# Patient Record
Sex: Male | Born: 1970 | Race: White | Hispanic: No | Marital: Single | Smoking: Former smoker
Health system: Southern US, Community
[De-identification: ages and names within clinical notes are randomized; demographics above are authoritative.]

## PROBLEM LIST (undated history)

## (undated) DIAGNOSIS — F319 Bipolar disorder, unspecified: Secondary | ICD-10-CM

## (undated) DIAGNOSIS — I2119 ST elevation (STEMI) myocardial infarction involving other coronary artery of inferior wall: Secondary | ICD-10-CM

## (undated) DIAGNOSIS — E785 Hyperlipidemia, unspecified: Secondary | ICD-10-CM

## (undated) DIAGNOSIS — I10 Essential (primary) hypertension: Secondary | ICD-10-CM

## (undated) HISTORY — DX: ST elevation (STEMI) myocardial infarction involving other coronary artery of inferior wall: I21.19

## (undated) HISTORY — DX: Essential (primary) hypertension: I10

---

## 2017-11-21 ENCOUNTER — Ambulatory Visit (HOSPITAL_COMMUNITY): Admit: 2017-11-21 | Payer: Self-pay | Admitting: Cardiology

## 2017-11-21 ENCOUNTER — Inpatient Hospital Stay (HOSPITAL_COMMUNITY)
Admission: EM | Disposition: A | Discharge status: Home / Self Care | Payer: Self-pay | Source: Home / Self Care | Attending: Cardiology

## 2017-11-21 ENCOUNTER — Encounter (HOSPITAL_COMMUNITY): Payer: Self-pay | Admitting: Cardiology

## 2017-11-21 ENCOUNTER — Inpatient Hospital Stay (HOSPITAL_COMMUNITY)
Admission: EM | Admit: 2017-11-21 | Discharge: 2017-11-24 | DRG: 250 | Disposition: A | Discharge status: Home / Self Care | Payer: BLUE CROSS/BLUE SHIELD | Attending: Cardiology | Admitting: Cardiology

## 2017-11-21 DIAGNOSIS — I472 Ventricular tachycardia: Secondary | ICD-10-CM | POA: Diagnosis present

## 2017-11-21 DIAGNOSIS — I110 Hypertensive heart disease with heart failure: Secondary | ICD-10-CM | POA: Diagnosis present

## 2017-11-21 DIAGNOSIS — I2119 ST elevation (STEMI) myocardial infarction involving other coronary artery of inferior wall: Principal | ICD-10-CM | POA: Diagnosis present

## 2017-11-21 DIAGNOSIS — E785 Hyperlipidemia, unspecified: Secondary | ICD-10-CM | POA: Diagnosis present

## 2017-11-21 DIAGNOSIS — F319 Bipolar disorder, unspecified: Secondary | ICD-10-CM | POA: Diagnosis present

## 2017-11-21 DIAGNOSIS — I5021 Acute systolic (congestive) heart failure: Secondary | ICD-10-CM | POA: Diagnosis present

## 2017-11-21 DIAGNOSIS — I2111 ST elevation (STEMI) myocardial infarction involving right coronary artery: Secondary | ICD-10-CM | POA: Diagnosis present

## 2017-11-21 DIAGNOSIS — Z87891 Personal history of nicotine dependence: Secondary | ICD-10-CM

## 2017-11-21 DIAGNOSIS — E876 Hypokalemia: Secondary | ICD-10-CM | POA: Diagnosis not present

## 2017-11-21 DIAGNOSIS — Z9861 Coronary angioplasty status: Secondary | ICD-10-CM

## 2017-11-21 DIAGNOSIS — Z8249 Family history of ischemic heart disease and other diseases of the circulatory system: Secondary | ICD-10-CM | POA: Diagnosis not present

## 2017-11-21 DIAGNOSIS — I2510 Atherosclerotic heart disease of native coronary artery without angina pectoris: Secondary | ICD-10-CM

## 2017-11-21 HISTORY — DX: Bipolar disorder, unspecified: F31.9

## 2017-11-21 HISTORY — PX: CORONARY BALLOON ANGIOPLASTY: CATH118233

## 2017-11-21 HISTORY — PX: LEFT HEART CATH AND CORONARY ANGIOGRAPHY: CATH118249

## 2017-11-21 HISTORY — PX: CORONARY THROMBECTOMY: CATH118304

## 2017-11-21 HISTORY — DX: Hyperlipidemia, unspecified: E78.5

## 2017-11-21 LAB — COMPREHENSIVE METABOLIC PANEL
ALBUMIN: 4 g/dL (ref 3.5–5.0)
ALT: 40 U/L (ref 0–44)
ANION GAP: 13 (ref 5–15)
AST: 34 U/L (ref 15–41)
Alkaline Phosphatase: 77 U/L (ref 38–126)
BUN: 8 mg/dL (ref 6–20)
CO2: 20 mmol/L — AB (ref 22–32)
Calcium: 8.6 mg/dL — ABNORMAL LOW (ref 8.9–10.3)
Chloride: 106 mmol/L (ref 98–111)
Creatinine, Ser: 2.45 mg/dL — ABNORMAL HIGH (ref 0.61–1.24)
GFR calc non Af Amer: 30 mL/min — ABNORMAL LOW (ref >60)
GFR, EST AFRICAN AMERICAN: 34 mL/min — AB (ref >60)
Glucose, Bld: 115 mg/dL — ABNORMAL HIGH (ref 70–99)
POTASSIUM: 3.8 mmol/L (ref 3.5–5.1)
Sodium: 139 mmol/L (ref 135–145)
Total Bilirubin: 0.5 mg/dL (ref 0.3–1.2)
Total Protein: 6.6 g/dL (ref 6.5–8.1)

## 2017-11-21 LAB — CBC
HCT: 39.9 % (ref 39.0–52.0)
Hemoglobin: 13.3 g/dL (ref 13.0–17.0)
MCH: 28.5 pg (ref 26.0–34.0)
MCHC: 33.3 g/dL (ref 30.0–36.0)
MCV: 85.6 fL (ref 78.0–100.0)
PLATELETS: 405 10*3/uL — AB (ref 150–400)
RBC: 4.66 MIL/uL (ref 4.22–5.81)
RDW: 14.6 % (ref 11.5–15.5)
WBC: 12.7 10*3/uL — ABNORMAL HIGH (ref 4.0–10.5)

## 2017-11-21 LAB — TROPONIN I: Troponin I: 0.07 ng/mL (ref <0.03)

## 2017-11-21 LAB — LIPID PANEL
CHOL/HDL RATIO: 4.1 ratio
Cholesterol: 165 mg/dL (ref 0–200)
HDL: 40 mg/dL — AB (ref >40)
LDL CALC: 112 mg/dL — AB (ref 0–99)
Triglycerides: 66 mg/dL (ref <150)
VLDL: 13 mg/dL (ref 0–40)

## 2017-11-21 LAB — PROTIME-INR
INR: 1.05
Prothrombin Time: 13.6 seconds (ref 11.4–15.2)

## 2017-11-21 LAB — APTT: aPTT: 28 seconds (ref 24–36)

## 2017-11-21 LAB — HEMOGLOBIN A1C
Hgb A1c MFr Bld: 5.5 % (ref 4.8–5.6)
Mean Plasma Glucose: 111.15 mg/dL

## 2017-11-21 LAB — POCT ACTIVATED CLOTTING TIME: ACTIVATED CLOTTING TIME: 329 s

## 2017-11-21 SURGERY — LEFT HEART CATH AND CORONARY ANGIOGRAPHY
Anesthesia: LOCAL

## 2017-11-21 MED ORDER — SODIUM CHLORIDE 0.9 % WEIGHT BASED INFUSION
1.0000 mL/kg/h | INTRAVENOUS | Status: AC
  Administered 2017-11-21: 1 mL/kg/h via INTRAVENOUS

## 2017-11-21 MED ORDER — IOPAMIDOL (ISOVUE-370) INJECTION 76%
INTRAVENOUS | Status: DC | PRN
  Administered 2017-11-21: 110 mL via INTRAVENOUS

## 2017-11-21 MED ORDER — ASPIRIN 81 MG PO CHEW
81.0000 mg | CHEWABLE_TABLET | Freq: Every day | ORAL | Status: DC
  Administered 2017-11-21 – 2017-11-24 (×4): 81 mg via ORAL
  Filled 2017-11-21 (×4): qty 1

## 2017-11-21 MED ORDER — TIROFIBAN (AGGRASTAT) BOLUS VIA INFUSION
INTRAVENOUS | Status: DC | PRN
  Administered 2017-11-21: 2267.975 ug via INTRAVENOUS

## 2017-11-21 MED ORDER — HEPARIN SODIUM (PORCINE) 5000 UNIT/ML IJ SOLN
5000.0000 [IU] | Freq: Three times a day (TID) | INTRAMUSCULAR | Status: DC
  Administered 2017-11-21 – 2017-11-24 (×7): 5000 [IU] via SUBCUTANEOUS
  Filled 2017-11-21 (×8): qty 1

## 2017-11-21 MED ORDER — HEPARIN SODIUM (PORCINE) 1000 UNIT/ML IJ SOLN
INTRAMUSCULAR | Status: DC | PRN
  Administered 2017-11-21: 9000 [IU] via INTRAVENOUS
  Administered 2017-11-21: 3000 [IU] via INTRAVENOUS

## 2017-11-21 MED ORDER — VERAPAMIL HCL 2.5 MG/ML IV SOLN
INTRAVENOUS | Status: DC | PRN
  Administered 2017-11-21: 10 mL via INTRA_ARTERIAL

## 2017-11-21 MED ORDER — BENZTROPINE MESYLATE 1 MG PO TABS
1.0000 mg | ORAL_TABLET | Freq: Two times a day (BID) | ORAL | Status: DC | PRN
  Filled 2017-11-21: qty 1

## 2017-11-21 MED ORDER — SODIUM CHLORIDE 0.9 % IV SOLN: 250.0000 mL | INTRAVENOUS | Status: DC | PRN

## 2017-11-21 MED ORDER — ONDANSETRON HCL 4 MG/2ML IJ SOLN
4.0000 mg | Freq: Four times a day (QID) | INTRAMUSCULAR | Status: DC | PRN
Start: 2017-11-21 — End: 2017-11-24
  Administered 2017-11-22 – 2017-11-23 (×3): 4 mg via INTRAVENOUS
  Filled 2017-11-21 (×3): qty 2

## 2017-11-21 MED ORDER — ATORVASTATIN CALCIUM 80 MG PO TABS
80.0000 mg | ORAL_TABLET | Freq: Every day | ORAL | Status: DC
  Administered 2017-11-21 – 2017-11-23 (×3): 80 mg via ORAL
  Filled 2017-11-21 (×3): qty 1

## 2017-11-21 MED ORDER — LIDOCAINE HCL (PF) 1 % IJ SOLN
INTRAMUSCULAR | Status: DC | PRN
  Administered 2017-11-21: 2 mL

## 2017-11-21 MED ORDER — CLOPIDOGREL BISULFATE 75 MG PO TABS
75.0000 mg | ORAL_TABLET | Freq: Every day | ORAL | Status: DC
  Administered 2017-11-22 – 2017-11-24 (×3): 75 mg via ORAL
  Filled 2017-11-21 (×3): qty 1

## 2017-11-21 MED ORDER — PANTOPRAZOLE SODIUM 40 MG PO TBEC
40.0000 mg | DELAYED_RELEASE_TABLET | Freq: Every day | ORAL | Status: DC
  Administered 2017-11-22 – 2017-11-23 (×2): 40 mg via ORAL
  Filled 2017-11-21 (×2): qty 1

## 2017-11-21 MED ORDER — TIROFIBAN HCL IV 12.5 MG/250 ML
INTRAVENOUS | Status: DC | PRN
  Administered 2017-11-21: 0.15 ug/kg/min via INTRAVENOUS

## 2017-11-21 MED ORDER — SODIUM CHLORIDE 0.9% FLUSH: 3.0000 mL | INTRAVENOUS | Status: DC | PRN

## 2017-11-21 MED ORDER — SODIUM CHLORIDE 0.9% FLUSH
3.0000 mL | Freq: Two times a day (BID) | INTRAVENOUS | Status: DC
  Administered 2017-11-22 – 2017-11-24 (×5): 3 mL via INTRAVENOUS

## 2017-11-21 MED ORDER — ACETAMINOPHEN 325 MG PO TABS: 650.0000 mg | ORAL_TABLET | ORAL | Status: DC | PRN

## 2017-11-21 MED ORDER — HEPARIN (PORCINE) IN NACL 2-0.9 UNITS/ML
INTRAMUSCULAR | Status: DC | PRN
  Administered 2017-11-21 (×2): 500 mL

## 2017-11-21 MED ORDER — CLOPIDOGREL BISULFATE 300 MG PO TABS
ORAL_TABLET | ORAL | Status: DC | PRN
  Administered 2017-11-21: 600 mg via ORAL

## 2017-11-21 MED ORDER — SODIUM CHLORIDE 0.9 % IV SOLN
INTRAVENOUS | Status: DC | PRN
  Administered 2017-11-21: 100 mL/h via INTRAVENOUS

## 2017-11-21 MED ORDER — TIROFIBAN HCL IV 12.5 MG/250 ML
0.0750 ug/kg/min | INTRAVENOUS | Status: DC
  Filled 2017-11-21: qty 250
  Filled 2017-11-21: qty 100
  Filled 2017-11-21: qty 250

## 2017-11-21 MED ORDER — NOREPINEPHRINE BITARTRATE 1 MG/ML IV SOLN
INTRAVENOUS | Status: DC | PRN
  Administered 2017-11-21: 5 ug/min via INTRAVENOUS

## 2017-11-21 MED ORDER — ARIPIPRAZOLE 5 MG PO TABS
10.0000 mg | ORAL_TABLET | Freq: Every day | ORAL | Status: DC
  Administered 2017-11-21 – 2017-11-23 (×3): 10 mg via ORAL
  Filled 2017-11-21 (×2): qty 1
  Filled 2017-11-21: qty 2

## 2017-11-21 SURGICAL SUPPLY — 15 items
BALLN EMERGE MR 2.0X12 (BALLOONS) ×2
BALLOON EMERGE MR 2.0X12 (BALLOONS) ×1 IMPLANT
CATH 5FR JL3.5 JR4 ANG PIG MP (CATHETERS) ×2 IMPLANT
CATH EXTRAC PRONTO 5.5F 138CM (CATHETERS) ×2 IMPLANT
CATH VISTA GUIDE 6FR JR4 (CATHETERS) ×2 IMPLANT
DEVICE RAD COMP TR BAND LRG (VASCULAR PRODUCTS) ×2 IMPLANT
GLIDESHEATH SLEND SS 6F .021 (SHEATH) ×2 IMPLANT
GUIDEWIRE INQWIRE 1.5J.035X260 (WIRE) ×1 IMPLANT
INQWIRE 1.5J .035X260CM (WIRE) ×2
KIT ENCORE 26 ADVANTAGE (KITS) ×2 IMPLANT
KIT HEART LEFT (KITS) ×2 IMPLANT
PACK CARDIAC CATHETERIZATION (CUSTOM PROCEDURE TRAY) ×2 IMPLANT
TRANSDUCER W/STOPCOCK (MISCELLANEOUS) ×2 IMPLANT
TUBING CIL FLEX 10 FLL-RA (TUBING) ×2 IMPLANT
WIRE ASAHI PROWATER 180CM (WIRE) ×2 IMPLANT

## 2017-11-21 NOTE — Progress Notes (Addendum)
Pharmacy  CONSULT NOTE - Initial Consult  Pharmacy Consult for [place]atro[place] Indication: chest pain/ACS, s/p cath lab  No Known Allergies  Patient Measurements: Height: 6\' 3"  (190.5 cm) Weight: 200 lb (90.7 kg)(pt reported) IBW/kg (Calculated) : 84.5  Vital Signs: BP: 137/86 (07/08 1727) Pulse Rate: 90 (07/08 1727)  Labs: Recent Labs    11/21/17 1548  HGB 13.3  HCT 39.9  PLT 405*  APTT 28  LABPROT 13.6  INR 1.05  CREATININE 2.45*  TROPONINI 0.07*    Estimated Creatinine Clearance: 44.5 mL/min (A) (by C-G formula based on SCr of 2.45 mg/dL (H)).   Medical History: Past Medical History:  Diagnosis Date  . Bipolar disorder (HCC)   . Hyperlipidemia   . Hypertension     Medications:  Scheduled:  . aspirin  81 mg Oral Daily  . atorvastatin  80 mg Oral q1800  . [START ON 11/22/2017] clopidogrel  75 mg Oral Q breakfast  . heparin  5,000 Units Subcutaneous Q8H  . [START ON 11/22/2017] sodium chloride flush  3 mL Intravenous Q12H    Assessment: 47 yo male admitted with STEMI.  Pharmacy asked to continue tirofiban x 18 hrs post-cath.  Scr elevated at 2.45 - estimated CrCl only ~ 44 ml/min  Goal of Therapy:   Monitor platelets by anticoagulation protocol: Yes   Plan:  Tirofiban 0.075 mcg/kg/min, continue x 18hrs. Check CBC in 8 hrs per protocol.  Jenetta DownerJessica Paelyn Smick, Pharm D, BCPS, [facility] Clinical Pharmacist Phone [phone]  11/21/2017 6:07 PM

## 2017-11-21 NOTE — H&P (Signed)
History & Physical    Patient ID: Antonio Nguyen MRN: [id number], DOB/AGE: 47-Feb-1972   Admit date: 11/21/2017   Primary Physician: No primary care provider on file. Primary Cardiologist: New    Patient Profile    47 yo male with PMH of HTN, HL, Bipolar disorder, former tobacco use who presented with chest pain and STEMI called in the field.   Past Medical History   Past Medical History:  Diagnosis Date  . Bipolar disorder ([place])   . Hyperlipidemia   . Hypertension     Allergies  No Known Allergies  History of Present Illness    Antonio Nguyen is a 47 yo male with PMH of TN, HL, Bipolar disorder, former tobacco use. He is a former smoker, reports he quit about 3 years ago. Was in his usual state of health until last evening. Felt nauseated and vomited, but improved. This morning he was awoken from sleep with chest discomfort. Was able to get ready and go to work where symptoms improved. States after lunch today his symptoms of chest pain returned. Co- workers Pharmacist, community. On arrival his EKG showed SR with acute ST elevation in inferior leads. He was given 324 ASA and 1 SL nitro. Pain was 5/10 on arrival to the ED. He was meet in the ER by Dr. [place] and taken directly to the cath lab for emergent cardiac cath.   Home Medications    Prior to Admission medications   Not on File    Family History    Family History  Problem Relation Age of Onset  . Hypertension Father     Social History    Social History   Socioeconomic History  . Marital status: Not on file    Spouse name: Not on file  . Number of children: Not on file  . Years of education: Not on file  . Highest education level: Not on file  Occupational History  . Not on file  Social Needs  . Financial resource strain: Not on file  . Food insecurity:    Worry: Not on file    Inability: Not on file  . Transportation needs:    Medical: Not on file    Non-medical: Not on file  Tobacco Use  . Smoking status: Former  Smoker  Substance and Sexual Activity  . Alcohol use: Not on file  . Drug use: Not on file  . Sexual activity: Not on file  Lifestyle  . Physical activity:    Days per week: Not on file    Minutes per session: Not on file  . Stress: Not on file  Relationships  . Social connections:    Talks on phone: Not on file    Gets together: Not on file    Attends religious service: Not on file    Active member of club or organization: Not on file    Attends meetings of clubs or organizations: Not on file    Relationship status: Not on file  . Intimate partner violence:    Fear of current or ex partner: Not on file    Emotionally abused: Not on file    Physically abused: Not on file    Forced sexual activity: Not on file  Other Topics Concern  . Not on file  Social History Narrative  . Not on file     Review of Systems    See HPI  All other systems reviewed and are otherwise negative except as noted above.  Physical Exam    HR- 90 BP 100/68 General: Younger WM, pale and slightly diaphoretic, NAD Psych: Normal affect. Neuro: Alert and oriented X 3. Moves all extremities spontaneously. HEENT: Normal  Neck: Supple without bruits or JVD. Lungs:  Resp regular and unlabored, CTA. Heart: RRR no s3, s4, or murmurs. Abdomen: Soft, non-tender, non-distended, BS + x 4.  Extremities: No clubbing, cyanosis or edema. DP/PT/Radials 2+ and equal bilaterally.  Labs    Troponin (Point of Care Test) No results for input(s): TROPIPOC in the last 72 hours. No results for input(s): CKTOTAL, CKMB, TROPONINI in the last 72 hours. No results found for: WBC, HGB, HCT, MCV, PLT No results for input(s): NA, K, CL, CO2, BUN, CREATININE, CALCIUM, PROT, BILITOT, ALKPHOS, ALT, AST, GLUCOSE in the last 168 hours.  Invalid input(s): LABALBU No results found for: CHOL, HDL, LDLCALC, TRIG No results found for: [facility]   Radiology Studies    No results found.  ECG & Cardiac Imaging    EKG: SR with  acute ST elevation in inferior leads.   Assessment & Plan    47 yo male with PMH of HTN, HL, Bipolar disorder, former tobacco use who presented with chest pain and STEMI called in the field.   1. Acute inferior STEMI: Had some nausea/vomiting last evening that resolved. Woke up this morning with chest pain that improved and was able to got to work. Chest pain returned acutely just after lunch. EKG with acute ST elevation in inferior leads. Given 35m ASA and 1SL nitro prior to arrival. Pain 5/10 on arrival. He was met in the ED and taken directly to the cath lab for emergent cardiac cath. Further recommendations to follow cath.   2. HTN: reports hx of the same, but does not report any antihypertensives prior to admission.   3. HL?: unclear if he was on a statin prior to admission -- statin post cath -- check lipids, and LFTs  4. Bipolar: Reports being on Abilify at home, unsure of dose.   5. Former Tobacco use: reports he quit about 3 years ago.    Severity of Illness: The appropriate patient status for this patient is INPATIENT. Inpatient status is judged to be reasonable and necessary in order to provide the required intensity of service to ensure the patient's safety. The patient's presenting symptoms, physical exam findings, and initial radiographic and laboratory data in the context of their chronic comorbidities is felt to place them at high risk for further clinical deterioration. Furthermore, it is not anticipated that the patient will be medically stable for discharge from the hospital within 2 midnights of admission. The following factors support the patient status of inpatient.   " The patient's presenting symptoms include chest pain. " The worrisome physical exam findings include normal exam. " The initial radiographic and laboratory data are worrisome because of EKG showing acute inferior STEMI. " The chronic co-morbidities include HTN, HL, former tobacco use.  * I certify  that at the point of admission it is my clinical judgment that the patient will require inpatient hospital care spanning beyond 2 midnights from the point of admission due to high intensity of service, high risk for further deterioration and high frequency of surveillance required.*     Signed, LReino Bellis NP-C Pager [phone]/12/2017, 3:50 PM

## 2017-11-22 ENCOUNTER — Other Ambulatory Visit: Payer: Self-pay

## 2017-11-22 ENCOUNTER — Encounter (HOSPITAL_COMMUNITY): Payer: Self-pay | Admitting: Cardiology

## 2017-11-22 ENCOUNTER — Inpatient Hospital Stay (HOSPITAL_COMMUNITY): Payer: BLUE CROSS/BLUE SHIELD

## 2017-11-22 DIAGNOSIS — I2111 ST elevation (STEMI) myocardial infarction involving right coronary artery: Secondary | ICD-10-CM

## 2017-11-22 DIAGNOSIS — E876 Hypokalemia: Secondary | ICD-10-CM

## 2017-11-22 DIAGNOSIS — I2510 Atherosclerotic heart disease of native coronary artery without angina pectoris: Secondary | ICD-10-CM

## 2017-11-22 DIAGNOSIS — I472 Ventricular tachycardia: Secondary | ICD-10-CM

## 2017-11-22 DIAGNOSIS — Z720 Tobacco use: Secondary | ICD-10-CM

## 2017-11-22 LAB — BASIC METABOLIC PANEL
ANION GAP: 15 (ref 5–15)
BUN: 5 mg/dL — ABNORMAL LOW (ref 6–20)
CO2: 19 mmol/L — ABNORMAL LOW (ref 22–32)
Calcium: 8.7 mg/dL — ABNORMAL LOW (ref 8.9–10.3)
Chloride: 105 mmol/L (ref 98–111)
Creatinine, Ser: 1.07 mg/dL (ref 0.61–1.24)
GFR calc Af Amer: >60 mL/min (ref >60)
Glucose, Bld: 101 mg/dL — ABNORMAL HIGH (ref 70–99)
POTASSIUM: 3.4 mmol/L — AB (ref 3.5–5.1)
SODIUM: 139 mmol/L (ref 135–145)

## 2017-11-22 LAB — CBC
HEMATOCRIT: 39.7 % (ref 39.0–52.0)
HEMATOCRIT: 39.9 % (ref 39.0–52.0)
HEMOGLOBIN: 13.5 g/dL (ref 13.0–17.0)
Hemoglobin: 13.3 g/dL (ref 13.0–17.0)
MCH: 29 pg (ref 26.0–34.0)
MCH: 29 pg (ref 26.0–34.0)
MCHC: 33.8 g/dL (ref 30.0–36.0)
MCHC: 33.5 g/dL (ref 30.0–36.0)
MCV: 85.6 fL (ref 78.0–100.0)
MCV: 86.7 fL (ref 78.0–100.0)
PLATELETS: 383 10*3/uL (ref 150–400)
Platelets: 374 10*3/uL (ref 150–400)
RBC: 4.58 MIL/uL (ref 4.22–5.81)
RBC: 4.66 MIL/uL (ref 4.22–5.81)
RDW: 14.6 % (ref 11.5–15.5)
RDW: 14.7 % (ref 11.5–15.5)
WBC: 9.6 10*3/uL (ref 4.0–10.5)
WBC: 9.7 10*3/uL (ref 4.0–10.5)

## 2017-11-22 LAB — TROPONIN I
TROPONIN I: 31.66 ng/mL — AB (ref <0.03)
Troponin I: 20.4 ng/mL (ref <0.03)

## 2017-11-22 LAB — MRSA PCR SCREENING: MRSA by PCR: NEGATIVE

## 2017-11-22 LAB — POCT I-STAT, CHEM 8
BUN: 9 mg/dL (ref 6–20)
CALCIUM ION: 1.17 mmol/L (ref 1.15–1.40)
CREATININE: 2.3 mg/dL — AB (ref 0.61–1.24)
Chloride: 105 mmol/L (ref 98–111)
GLUCOSE: 119 mg/dL — AB (ref 70–99)
HEMATOCRIT: 40 % (ref 39.0–52.0)
HEMOGLOBIN: 13.6 g/dL (ref 13.0–17.0)
Potassium: 3.9 mmol/L (ref 3.5–5.1)
SODIUM: 141 mmol/L (ref 135–145)
TCO2: 21 mmol/L — AB (ref 22–32)

## 2017-11-22 LAB — ECHOCARDIOGRAM COMPLETE
Height: 75 in
WEIGHTICAEL: 3200 [oz_av]

## 2017-11-22 LAB — POCT ACTIVATED CLOTTING TIME: Activated Clotting Time: 208 seconds

## 2017-11-22 MED ORDER — PNEUMOCOCCAL VAC POLYVALENT 25 MCG/0.5ML IJ INJ
0.5000 mL | INJECTION | INTRAMUSCULAR | Status: DC
  Filled 2017-11-22: qty 0.5

## 2017-11-22 MED ORDER — METOPROLOL TARTRATE 25 MG PO TABS
25.0000 mg | ORAL_TABLET | Freq: Two times a day (BID) | ORAL | Status: DC
  Administered 2017-11-22 (×2): 25 mg via ORAL
  Filled 2017-11-22 (×2): qty 1

## 2017-11-22 MED ORDER — POTASSIUM CHLORIDE CRYS ER 20 MEQ PO TBCR
60.0000 meq | EXTENDED_RELEASE_TABLET | Freq: Every day | ORAL | Status: DC
Start: 2017-11-22 — End: 2017-11-24
  Administered 2017-11-22 – 2017-11-24 (×3): 60 meq via ORAL
  Filled 2017-11-22 (×3): qty 3

## 2017-11-22 MED FILL — Nitroglycerin IV Soln 100 MCG/ML in D5W: INTRA_ARTERIAL | Qty: 10 | Status: AC

## 2017-11-22 MED FILL — Heparin Sod (Porcine)-NaCl IV Soln 1000 Unit/500ML-0.9%: INTRAVENOUS | Qty: 500 | Status: AC

## 2017-11-22 NOTE — Progress Notes (Signed)
Called to 2h for TR band removal. TR band removed with 4 cc air present. No Hematoma or ecchymosis present. Tegaderm dressing applied, bedrest instructions given. Spo2 probe applied to right thumb. Spo2 99%. Armboard loosely applied to right wrist as reminder not to bend wrist.

## 2017-11-22 NOTE — Progress Notes (Signed)
Critical lab value received, troponin 20.40  Provider not notified d/t patient admitted for stemi and is s/p left heart catheterization with intervention. Patient is chest pain free and otherwise asymptomatic. Will monitor next lab level.

## 2017-11-22 NOTE — Progress Notes (Signed)
  Echocardiogram 2D Echocardiogram has been performed.  Antonio ChimesWendy  Mame Nguyen 11/22/2017, 10:37 AM

## 2017-11-22 NOTE — Progress Notes (Signed)
CARDIAC REHAB PHASE I   PRE:  Rate/Rhythm: 96 SR    BP: sitting 114/82    SaO2: 97 RA  MODE:  Ambulation: 560 ft   POST:  Rate/Rhythm: 104 ST    BP: sitting 120/78     SaO2: 98 RA  Tolerated well, no c/o. Occasional single PVC after walking. Ed completed with pt and parents, good reception. [facility] however he does not have insurance right now. Encouraged another walk tonight.  [phone]   Harriet MassonRandi Kristan Tykeisha Peer CES, ACSM 11/22/2017 2:02 PM

## 2017-11-22 NOTE — Progress Notes (Addendum)
Progress Note  Patient Name: Antonio Nguyen Date of Encounter: 11/22/2017  Primary Cardiologist: [place]  Subjective   No pain or dyspnea this am.   Inpatient Medications    Scheduled Meds: . ARIPiprazole  10 mg Oral QHS  . aspirin  81 mg Oral Daily  . atorvastatin  80 mg Oral q1800  . clopidogrel  75 mg Oral Q breakfast  . heparin  5,000 Units Subcutaneous Q8H  . pantoprazole  40 mg Oral QAC supper  . sodium chloride flush  3 mL Intravenous Q12H   Continuous Infusions: . sodium chloride    . tirofiban 0.075 mcg/kg/min (11/22/17 0600)   PRN Meds: sodium chloride, acetaminophen, benztropine, ondansetron (ZOFRAN) IV, sodium chloride flush   Vital Signs    Vitals:   11/22/17 0346 11/22/17 0400 11/22/17 0500 11/22/17 0600  BP:  98/65 96/66 113/87  Pulse:  71 78 71  Resp:  18 17 13   Temp: 98.6 F (37 C)     TempSrc: Oral     SpO2:  100% 96% 99%  Weight:      Height:        Intake/Output Summary (Last 24 hours) at 11/22/2017 0817 Last data filed at 11/22/2017 0600 Gross per 24 hour  Intake 779.54 ml  Output 2525 ml  Net -1745.46 ml   Filed Weights   11/21/17 1616  Weight: 200 lb (90.7 kg)    Telemetry    Sinus, NSVT - Personally Reviewed  ECG    Sinus, incomplete RBBB, subtle inferior ST elevation-resolving - Personally Reviewed  Physical Exam   GEN: No acute distress.   Neck: No JVD Cardiac: RRR, no murmurs, rubs, or gallops.  Respiratory: Clear to auscultation bilaterally. GI: Soft, nontender, non-distended  MS: No edema; No deformity. Neuro:  Nonfocal  Psych: Normal affect   Labs    Chemistry Recent Labs  Lab 11/21/17 1548 11/22/17 0515  NA 139 139  K 3.8 3.4*  CL 106 105  CO2 20* 19*  GLUCOSE 115* 101*  BUN 8 5*  CREATININE 2.45* 1.07  CALCIUM 8.6* 8.7*  PROT 6.6  --   ALBUMIN 4.0  --   AST 34  --   ALT 40  --   ALKPHOS 77  --   BILITOT 0.5  --   GFRNONAA 30* >60  GFRAA 34* >60  ANIONGAP 13 15     Hematology Recent Labs    Lab 11/21/17 1548 11/21/17 2345 11/22/17 0515  WBC 12.7* 9.7 9.6  RBC 4.66 4.58 4.66  HGB 13.3 13.3 13.5  HCT 39.9 39.7 39.9  MCV 85.6 86.7 85.6  MCH 28.5 29.0 29.0  MCHC 33.3 33.5 33.8  RDW 14.6 14.7 14.6  PLT 405* 383 374    Cardiac Enzymes Recent Labs  Lab 11/21/17 1548 11/21/17 2345 11/22/17 0515  TROPONINI 0.07* 20.40* 31.66*   No results for input(s): TROPIPOC in the last 168 hours.   BNPNo results for input(s): BNP, PROBNP in the last 168 hours.   DDimer No results for input(s): DDIMER in the last 168 hours.   Radiology    No results found.  Cardiac Studies   Cardiac cath 11/21/17:  Prox RCA lesion is 25% stenosed.  1st RPLB lesion is 100% stenosed.  RPDA lesion is 100% stenosed.  Balloon angioplasty was performed using a BALLOON EMERGE MR 2.0X12.  Post intervention, there is a 0% residual stenosis.  LV end diastolic pressure is normal.   1. Acute inferior STEMI secondary to distal embolization  of thrombus into the PDA and PLOM branches from a nonobstructive ulcerative plaque in the proximal RCA 2. Normal LVEDP 3. LV gram not performed due to high creatinine 4. Successful reperfusion of the PDA with aspiration thrombectomy and PTCA  Patient Profile     47 y.o. male with HTN, tobacco abuse admitted with an acute inferior STEMI on 11/21/17. Cardiac cath with occlusion of the distal RCA branches felt to be embolic from proximal RCA ruptured plaque. Balloon angioplasty and aspiration thrombectomy performed with no stent placement.   Assessment & Plan    1. CAD/Inferior STEMI: Aspiration thrombectomy and balloon angioplasty distal RCA branches. Occlusion felt to be due to distal embolization from proximal RCA ruptured plaque. No stent placed. Aggrastat infusion overnight and to stop this am around 10am. No chest pain this am. Will continue DAPT with ASA and Plavix for one year. Will continue high intensity statin. Will add low dose beta blocker. Echo later  today. Add in Ace-inh prior to d/c if BP tolerates.  2. Hypokalemia: Replace potassium this am  3. Tobacco abuse: Smoking cessation is recommended.   4. NSVT: Several 5-6 beat runs of VT this am. Will add low dose beta blocker.   Will leave in ICU today given NSVT. He will finish his Aggrastat drip later this am.   For questions or updates, please contact [facility] Please consult [email or web address] for contact info under Cardiology/STEMI.      Signed, Verne Carrowhristopher Shiori Adcox, MD  11/22/2017, 8:17 AM

## 2017-11-23 LAB — CBC
HEMATOCRIT: 39 % (ref 39.0–52.0)
Hemoglobin: 13 g/dL (ref 13.0–17.0)
MCH: 28.7 pg (ref 26.0–34.0)
MCHC: 33.3 g/dL (ref 30.0–36.0)
MCV: 86.1 fL (ref 78.0–100.0)
Platelets: 362 10*3/uL (ref 150–400)
RBC: 4.53 MIL/uL (ref 4.22–5.81)
RDW: 14.4 % (ref 11.5–15.5)
WBC: 9 10*3/uL (ref 4.0–10.5)

## 2017-11-23 LAB — BASIC METABOLIC PANEL
Anion gap: 7 (ref 5–15)
BUN: 9 mg/dL (ref 6–20)
CALCIUM: 9 mg/dL (ref 8.9–10.3)
CHLORIDE: 105 mmol/L (ref 98–111)
CO2: 27 mmol/L (ref 22–32)
CREATININE: 0.98 mg/dL (ref 0.61–1.24)
GFR calc Af Amer: >60 mL/min (ref >60)
GFR calc non Af Amer: >60 mL/min (ref >60)
Glucose, Bld: 109 mg/dL — ABNORMAL HIGH (ref 70–99)
POTASSIUM: 3.3 mmol/L — AB (ref 3.5–5.1)
Sodium: 139 mmol/L (ref 135–145)

## 2017-11-23 MED ORDER — METOPROLOL TARTRATE 12.5 MG HALF TABLET
12.5000 mg | ORAL_TABLET | Freq: Two times a day (BID) | ORAL | Status: DC
  Administered 2017-11-23 – 2017-11-24 (×3): 12.5 mg via ORAL
  Filled 2017-11-23 (×3): qty 1

## 2017-11-23 NOTE — Progress Notes (Signed)
CARDIAC REHAB PHASE I   PRE:  Rate/Rhythm: 76 SR    BP: sitting 109/73    SaO2:   MODE:  Ambulation: 370 ft   POST:  Rate/Rhythm: 88 SR    BP: sitting 112/74     SaO2:   Tolerated well, no c/o. Pt does not smoke any more. Reviewed Plavix. Encouraged more walking. [phone]   Harriet MassonRandi Kristan Elizebeth Kluesner CES, ACSM 11/23/2017 10:53 AM

## 2017-11-23 NOTE — Progress Notes (Signed)
Progress Note  Patient Name: Antonio Nguyen Date of Encounter: 11/23/2017  Primary Cardiologist: No primary care provider on file.   Subjective   Feels better. No CP or dyspnea.   Inpatient Medications    Scheduled Meds: . ARIPiprazole  10 mg Oral QHS  . aspirin  81 mg Oral Daily  . atorvastatin  80 mg Oral q1800  . clopidogrel  75 mg Oral Q breakfast  . heparin  5,000 Units Subcutaneous Q8H  . metoprolol tartrate  25 mg Oral BID  . pantoprazole  40 mg Oral QAC supper  . pneumococcal 23 valent vaccine  0.5 mL Intramuscular Tomorrow-1000  . potassium chloride  60 mEq Oral Daily  . sodium chloride flush  3 mL Intravenous Q12H   Continuous Infusions: . sodium chloride     PRN Meds: sodium chloride, acetaminophen, benztropine, ondansetron (ZOFRAN) IV, sodium chloride flush   Vital Signs    Vitals:   11/23/17 0700 11/23/17 0731 11/23/17 0754 11/23/17 0825  BP: (!) 79/61 96/65  95/76  Pulse: 67 63    Resp: (!) 21 17  16   Temp:   97.7 F (36.5 C)   TempSrc:   Oral   SpO2: 95% 98%    Weight:      Height:        Intake/Output Summary (Last 24 hours) at 11/23/2017 0840 Last data filed at 11/23/2017 0800 Gross per 24 hour  Intake 1336.37 ml  Output 1375 ml  Net -38.63 ml   Filed Weights   11/21/17 1616  Weight: 200 lb (90.7 kg)    Telemetry    Sinus rhythm, few PVC's - Personally Reviewed   Physical Exam  Alert, oriented in NAD GEN: No acute distress.   Neck: No JVD Cardiac: RRR, no murmurs, rubs, or gallops.  Respiratory: Clear to auscultation bilaterally. GI: Soft, nontender, non-distended  MS: No edema; No deformity. Right radial site clear Neuro:  Nonfocal  Psych: Normal affect   Labs    Chemistry Recent Labs  Lab 11/21/17 1548 11/22/17 0515 11/23/17 0012  NA 139 139 139  K 3.8 3.4* 3.3*  CL 106 105 105  CO2 20* 19* 27  GLUCOSE 115* 101* 109*  BUN 8 5* 9  CREATININE 2.45* 1.07 0.98  CALCIUM 8.6* 8.7* 9.0  PROT 6.6  --   --   ALBUMIN  4.0  --   --   AST 34  --   --   ALT 40  --   --   ALKPHOS 77  --   --   BILITOT 0.5  --   --   GFRNONAA 30* >60 >60  GFRAA 34* >60 >60  ANIONGAP 13 15 7      Hematology Recent Labs  Lab 11/21/17 2345 11/22/17 0515 11/23/17 0012  WBC 9.7 9.6 9.0  RBC 4.58 4.66 4.53  HGB 13.3 13.5 13.0  HCT 39.7 39.9 39.0  MCV 86.7 85.6 86.1  MCH 29.0 29.0 28.7  MCHC 33.5 33.8 33.3  RDW 14.7 14.6 14.4  PLT 383 374 362    Cardiac Enzymes Recent Labs  Lab 11/21/17 1548 11/21/17 2345 11/22/17 0515  TROPONINI 0.07* 20.40* 31.66*   No results for input(s): TROPIPOC in the last 168 hours.   BNPNo results for input(s): BNP, PROBNP in the last 168 hours.   DDimer No results for input(s): DDIMER in the last 168 hours.   Radiology    No results found.  Cardiac Studies   Cardiac cath 11/21/17:  Prox  RCA lesion is 25% stenosed.  1st RPLB lesion is 100% stenosed.  RPDA lesion is 100% stenosed.  Balloon angioplasty was performed using a BALLOON EMERGE MR 2.0X12.  Post intervention, there is a 0% residual stenosis.  LV end diastolic pressure is normal.  1. Acute inferior STEMI secondary to distal embolization of thrombus into the PDA and PLOM branches from a nonobstructive ulcerative plaque in the proximal RCA 2. Normal LVEDP 3. LV gram not performed due to high creatinine 4. Successful reperfusion of the PDA with aspiration thrombectomy and PTCA  Echo 11-22-17: ------------------------------------------------------------------- Study Conclusions  - Left ventricle: The cavity size was normal. Wall thickness was   normal. Systolic function was mildly reduced. The estimated   ejection fraction was in the range of 45% to 50%. Hypokinesis of   the inferior and inferoseptal myocardium. - Right ventricle: The cavity size was mildly dilated. Wall   thickness was normal. Systolic function was moderately to   severely reduced.  Impressions:  - Focal wall motion abnormalities in  inferior and inferoseptal   walls with overall mild decrease in LV ejection fraction. RV   systolic function severely reduced, best seen in subcostal   windows.   Patient Profile     47 y.o. male with HTN, tobacco abuse admitted with an acute inferior STEMI on 11/21/17. Cardiac cath with occlusion of the distal RCA branches felt to be embolic from proximal RCA ruptured plaque. Balloon angioplasty and aspiration thrombectomy performed with no stent placement.   Assessment & Plan    1. Inferior STEMI: progressing well. Heart rhythm has stabilized. Medication reviewed will reduce metoprolol to 12.5 mg as BP is soft. Tx tele today.  2. Hyperlipidemia: initiate high intensity statin with atorvastatin 80 mg.   3. Tobacco abuse: smoking cessation counseling done  Dispo: tx tele today, anticipate home tomorrow  For questions or updates, please contact [facility] Please consult [email or web address] for contact info under Cardiology/STEMI.      Signed, Tonny Bollman, MD  11/23/2017, 8:40 AM

## 2017-11-24 ENCOUNTER — Encounter (HOSPITAL_COMMUNITY): Payer: Self-pay | Admitting: Cardiology

## 2017-11-24 DIAGNOSIS — E785 Hyperlipidemia, unspecified: Secondary | ICD-10-CM

## 2017-11-24 DIAGNOSIS — I5021 Acute systolic (congestive) heart failure: Secondary | ICD-10-CM

## 2017-11-24 LAB — BASIC METABOLIC PANEL
Anion gap: 9 (ref 5–15)
BUN: 10 mg/dL (ref 6–20)
CALCIUM: 9.4 mg/dL (ref 8.9–10.3)
CHLORIDE: 105 mmol/L (ref 98–111)
CO2: 26 mmol/L (ref 22–32)
CREATININE: 0.97 mg/dL (ref 0.61–1.24)
GFR calc non Af Amer: >60 mL/min (ref >60)
Glucose, Bld: 107 mg/dL — ABNORMAL HIGH (ref 70–99)
Potassium: 4.1 mmol/L (ref 3.5–5.1)
SODIUM: 140 mmol/L (ref 135–145)

## 2017-11-24 MED ORDER — ASPIRIN 81 MG PO CHEW: 81.0000 mg | CHEWABLE_TABLET | Freq: Every day | ORAL | Status: AC

## 2017-11-24 MED ORDER — NITROGLYCERIN 0.4 MG SL SUBL: 0.4000 mg | SUBLINGUAL_TABLET | SUBLINGUAL | 2 refills | Status: AC | PRN

## 2017-11-24 MED ORDER — ATORVASTATIN CALCIUM 80 MG PO TABS: 80.0000 mg | ORAL_TABLET | Freq: Every day | ORAL | 1 refills | Status: DC

## 2017-11-24 MED ORDER — METOPROLOL TARTRATE 25 MG PO TABS: 12.5000 mg | ORAL_TABLET | Freq: Two times a day (BID) | ORAL | 1 refills | Status: DC

## 2017-11-24 MED ORDER — CLOPIDOGREL BISULFATE 75 MG PO TABS: 75.0000 mg | ORAL_TABLET | Freq: Every day | ORAL | 2 refills | Status: DC

## 2017-11-24 NOTE — Progress Notes (Signed)
Pt has been walking independently. He feels good, no problems. Reviewed Plavix. He has no questions. Will sign off. [phone] Ethelda ChickKristan Sibel Khurana CES, ACSM 10:12 AM 11/24/2017

## 2017-11-24 NOTE — Discharge Summary (Addendum)
Discharge Summary    Patient ID: Antonio Nguyen,  MRN: [id number], DOB/AGE: 01-29-1971 47 y.o.  Admit date: 11/21/2017 Discharge date: 11/24/2017  Primary Care Provider: No primary care provider on file. Primary Cardiologist: New to [facility]   Discharge Diagnoses    Principal Problem:   STEMI (ST elevation myocardial infarction) [facility]) Active Problems:   STEMI involving right coronary artery ([place])   Hyperlipidemia   Acute systolic heart failure (HCC)   Allergies No Known Allergies  Diagnostic Studies/Procedures    Cath: 11/21/17   Prox RCA lesion is 25% stenosed.  1st RPLB lesion is 100% stenosed.  RPDA lesion is 100% stenosed.  Balloon angioplasty was performed using a BALLOON EMERGE MR 2.0X12.  Post intervention, there is a 0% residual stenosis.  LV end diastolic pressure is normal.   1. Acute inferior STEMI secondary to distal embolization of thrombus into the PDA and PLOM branches from a nonobstructive ulcerative plaque in the proximal RCA 2. Normal LVEDP 3. LV gram not performed due to high creatinine 4. Successful reperfusion of the PDA with aspiration thrombectomy and PTCA    Recommend uninterrupted dual antiplatelet therapy with Aspirin 76m daily and Clopidogrel 742mdaily for a minimum of 12 months (ACS - Class I recommendation). Will continue Aggrastat IV for 18 hours. Assess LV function with Echo.   TTE: 11/22/17  Study Conclusions  - Left ventricle: The cavity size was normal. Wall thickness was   normal. Systolic function was mildly reduced. The estimated   ejection fraction was in the range of 45% to 50%. Hypokinesis of   the inferior and inferoseptal myocardium. - Right ventricle: The cavity size was mildly dilated. Wall   thickness was normal. Systolic function was moderately to   severely reduced.  Impressions:  - Focal wall motion abnormalities in inferior and inferoseptal   walls with overall mild decrease in LV ejection fraction.  RV   systolic function severely reduced, best seen in subcostal   windows. _____________   History of Present Illness     [age]o male with PMH of HTN, HL, Bipolar disorder, former tobacco use. He is a former smoker, reports he quit about 3 years ago. Was in his usual state of health until the evening prior to admission. Felt nauseated and vomited, but improved. The following morning he was awoken from sleep with chest discomfort. Was able to get ready and go to work where symptoms improved. Stated after lunch that day his symptoms of chest pain returned. Co- workers caPharmacist, communityOn arrival his EKG showed SR with acute ST elevation in inferior leads. He was given 324 ASA and 1 SL nitro. Pain was 5/10 on arrival to the ED. He was met in the ER by Dr. JoMartiniquend taken directly to the cath lab for emergent cardiac cath.   Hospital Course       Underwent cath noted above with acute distal embolization of thrombus into the PDA and PLOM branches from nonobstructive ulcerative plaque from the pRCA. Had successful reperfusion of the PDA with aspiration thrombectomy and PTCA. Placed on DAPT with ASA/plavix for at least one year. He was continued on Aggrastat for 18 hours post cath. Follow up echo showed EF of 45-50% with hypokinesis in the inferior and inferoseptal wall. Also noted severely reduced RV function. Post cath did have several episodes of NSVT on telemetry and he was keep in the unit an additional day. LDL noted at 112, therefore was placed on high dose statin.  Also added low dose metoprolol. His rhythm stabilized, but unable to tolerate further increase in BB 2/2 to soft blood pressures.   General: Well developed, well nourished, male appearing in no acute distress. Head: Normocephalic, atraumatic.  Neck: Supple, no JVD. Lungs:  Resp regular and unlabored, CTA. Heart: RRR, S1, S2, no S3, S4, or murmur; no rub. Abdomen: Soft, non-tender, non-distended with normoactive bowel sounds. Extremities: No  clubbing, cyanosis, edema. Distal pedal pulses are 2+ bilaterally. Right radial cath site stable without bruising or hematoma Neuro: Alert and oriented X 3. Moves all extremities spontaneously. Psych: Normal affect.  Antonio Nguyen was seen by Dr. Burt Knack and determined stable for discharge home. Follow up in the office has been arranged. Medications are listed below.   _____________  Discharge Vitals Blood pressure 111/72, pulse 76, temperature 98 F (36.7 C), temperature source Oral, resp. rate 12, height _0  (1.905 m), weight 206 lb (93.4 kg), SpO2 97 %.  Filed Weights   11/21/17 1616 11/24/17 0438  Weight: 200 lb (90.7 kg) 206 lb (93.4 kg)    Labs & Radiologic Studies    CBC Recent Labs    11/22/17 0515 11/23/17 0012  WBC 9.6 9.0  HGB 13.5 13.0  HCT 39.9 39.0  MCV 85.6 86.1  PLT 374 226   Basic Metabolic Panel Recent Labs    11/23/17 0012 11/24/17 1048  NA 139 140  K 3.3* 4.1  CL 105 105  CO2 27 26  GLUCOSE 109* 107*  BUN 9 10  CREATININE 0.98 0.97  CALCIUM 9.0 9.4   Liver Function Tests Recent Labs    11/21/17 1548  AST 34  ALT 40  ALKPHOS 77  BILITOT 0.5  PROT 6.6  ALBUMIN 4.0   No results for input(s): LIPASE, AMYLASE in the last 72 hours. Cardiac Enzymes Recent Labs    11/21/17 1548 11/21/17 2345 11/22/17 0515  TROPONINI 0.07* 20.40* 31.66*   BNP Invalid input(s): POCBNP D-Dimer No results for input(s): DDIMER in the last 72 hours. Hemoglobin A1C Recent Labs    11/21/17 1545  HGBA1C 5.5   Fasting Lipid Panel Recent Labs    11/21/17 1545  CHOL 165  HDL 40*  LDLCALC 112*  TRIG 66  CHOLHDL 4.1   Thyroid Function Tests No results for input(s): TSH, T4TOTAL, T3FREE, THYROIDAB in the last 72 hours.  Invalid input(s): FREET3 _____________  No results found. Disposition   Pt is being discharged home today in good condition.  Follow-up Plans & Appointments    Follow-up Information    [facility] Follow up on  11/28/2017.   Specialty:  Cardiology Why:  at 1pm for your follow up appt.  Contact information: [place] [phone]         Discharge Instructions    Amb Referral to Cardiac Rehabilitation   Complete by:  As directed    Diagnosis:   PTCA STEMI     Call MD for:  redness, tenderness, or signs of infection (pain, swelling, redness, odor or green/yellow discharge around incision site)   Complete by:  As directed    Diet - low sodium heart healthy   Complete by:  As directed    Discharge instructions   Complete by:  As directed    Radial Site Care Refer to this sheet in the next few weeks. These instructions provide you with information on caring for yourself after your procedure. Your caregiver may also give you more specific instructions.  Your treatment has been planned according to current medical practices, but problems sometimes occur. Call your caregiver if you have any problems or questions after your procedure. HOME CARE INSTRUCTIONS You may shower the day after the procedure.Remove the bandage (dressing) and gently wash the site with plain soap and water.Gently pat the site dry.  Do not apply powder or lotion to the site.  Do not submerge the affected site in water for 3 to 5 days.  Inspect the site at least twice daily.  Do not flex or bend the affected arm for 24 hours.  No lifting over 5 pounds (2.3 kg) for 5 days after your procedure.  Do not drive home if you are discharged the same day of the procedure. Have someone else drive you.  You may drive 24 hours after the procedure unless otherwise instructed by your caregiver.  What to expect: Any bruising will usually fade within 1 to 2 weeks.  Blood that collects in the tissue (hematoma) may be painful to the touch. It should usually decrease in size and tenderness within 1 to 2 weeks.  SEEK IMMEDIATE MEDICAL CARE IF: You have unusual pain at the radial site.  You have redness,  warmth, swelling, or pain at the radial site.  You have drainage (other than a small amount of blood on the dressing).  You have chills.  You have a fever or persistent symptoms for more than 72 hours.  You have a fever and your symptoms suddenly get worse.  Your arm becomes pale, cool, tingly, or numb.  You have heavy bleeding from the site. Hold pressure on the site.   PLEASE DO NOT MISS ANY DOSES OF YOUR PLAVIX!!!!! Also keep a log of you blood pressures and bring back to your follow up appt. Please call the office with any questions.   Patients taking blood thinners should generally stay away from medicines like ibuprofen, Advil, Motrin, naproxen, and Aleve due to risk of stomach bleeding. You may take Tylenol as directed or talk to your primary doctor about alternatives.   Increase activity slowly   Complete by:  As directed       Discharge Medications     Medication List    STOP taking these medications   losartan-hydrochlorothiazide 100-12.5 MG tablet Commonly known as:  HYZAAR     TAKE these medications   ARIPiprazole 10 MG tablet Commonly known as:  ABILIFY Take 10 mg by mouth at bedtime.   aspirin 81 MG chewable tablet Chew 1 tablet (81 mg total) by mouth daily. Start taking on:  11/25/2017   atorvastatin 80 MG tablet Commonly known as:  LIPITOR Take 1 tablet (80 mg total) by mouth daily at 6 PM.   benztropine 1 MG tablet Commonly known as:  COGENTIN Take 1 mg by mouth 2 (two) times daily as needed (muscle tension/shaking/jitters).   clopidogrel 75 MG tablet Commonly known as:  PLAVIX Take 1 tablet (75 mg total) by mouth daily with breakfast. Start taking on:  11/25/2017   metoprolol tartrate 25 MG tablet Commonly known as:  LOPRESSOR Take 0.5 tablets (12.5 mg total) by mouth 2 (two) times daily.   nitroGLYCERIN 0.4 MG SL tablet Commonly known as:  NITROSTAT Place 1 tablet (0.4 mg total) under the tongue every 5 (five) minutes as needed.   pantoprazole  40 MG tablet Commonly known as:  PROTONIX Take 40 mg by mouth daily before supper.        Acute coronary syndrome (MI, NSTEMI,  STEMI, etc) this admission?: Yes.     AHA/ACC Clinical Performance & Quality Measures: 1. Aspirin prescribed? - Yes 2. ADP Receptor Inhibitor (Plavix/Clopidogrel, Brilinta/Ticagrelor or Effient/Prasugrel) prescribed (includes medically managed patients)? - Yes 3. Beta Blocker prescribed? - Yes 4. High Intensity Statin (Lipitor 40-11m or Crestor 20-434m prescribed? - Yes 5. EF assessed during THIS hospitalization? - Yes 6. For EF <40%, was ACEI/ARB prescribed? - Not Applicable (EF >/= 4001%7. For EF <40%, Aldosterone Antagonist (Spironolactone or Eplerenone) prescribed? - Not Applicable (EF >/= 4075%8. Cardiac Rehab Phase II ordered (Included Medically managed Patients)? - Yes   Outstanding Labs/Studies   FLP/LFTs in 6 weeks if tolerating statin.   Duration of Discharge Encounter   Greater than 30 minutes including physician time.  Signed, LiReino BellisP-C 11/24/2017, 12:58 PM  Patient seen, examined. Available data reviewed. Agree with findings, assessment, and plan as outlined by LiReino BellisNP-C.  On my exam the patient is alert and oriented in no distress.  Lungs are clear, heart is regular rate and rhythm with no murmur gallop, abdomen is soft and nontender, extremities with no edema, right radial site is clear.  The patient has had no recurrent chest pain.  He is stable on a combination of aspirin, clopidogrel, a high intensity statin drug with atorvastatin, and low-dose metoprolol.  His blood pressure will not allow for further titration of his beta-blocker.  He can return to work in 2 weeks and he has requested a note for such.  [facility]  The importance of strict medication adherence is reviewed with the patient.  MiSherren MochaM.D. 11/24/2017 1:26 PM

## 2017-11-24 NOTE — Care Management Note (Addendum)
Case Management Note  Patient Details  Name: Antonio Nguyen MRN: [id number] Date of Birth: [birth year]  Subjective/Objective:      Pt admitted with STEMI. He is from home alone. Pt without PCP and insurance.              Action/Plan: Pt discharging home with self care. CM met with patient and he states he has started a new job and his benefits have not started. Pt states he has been seen by financial counseling at [facility]. Pt to obtain a PCP once his benefits start.  CM provided him information on [facility] and [facility] and the use of the pharmacy.  Pts meds today sent to [facility] in [place], [place]. Pt states he feels he can afford the medications with the coupon CM provided him.  Pt has transportation home.   Expected Discharge Date:  11/24/17               Expected Discharge Plan:  Home/Self Care  In-House Referral:     Discharge planning Services  CM Consult, Medication Assistance, [facility]  Post Acute Care Choice:    Choice offered to:     DME Arranged:    DME Agency:     HH Arranged:    HH Agency:     Status of Service:  Completed, signed off  If discussed at [employer], dates discussed:    Additional Comments:  Pollie Friar, RN 11/24/2017, 2:44 PM

## 2017-11-28 ENCOUNTER — Ambulatory Visit (INDEPENDENT_AMBULATORY_CARE_PROVIDER_SITE_OTHER): Payer: Self-pay | Admitting: Cardiology

## 2017-11-28 ENCOUNTER — Encounter: Payer: Self-pay | Admitting: Cardiology

## 2017-11-28 DIAGNOSIS — Z8659 Personal history of other mental and behavioral disorders: Secondary | ICD-10-CM

## 2017-11-28 DIAGNOSIS — I255 Ischemic cardiomyopathy: Secondary | ICD-10-CM | POA: Insufficient documentation

## 2017-11-28 DIAGNOSIS — E785 Hyperlipidemia, unspecified: Secondary | ICD-10-CM

## 2017-11-28 DIAGNOSIS — Z9861 Coronary angioplasty status: Secondary | ICD-10-CM

## 2017-11-28 DIAGNOSIS — I2510 Atherosclerotic heart disease of native coronary artery without angina pectoris: Secondary | ICD-10-CM

## 2017-11-28 DIAGNOSIS — I2111 ST elevation (STEMI) myocardial infarction involving right coronary artery: Secondary | ICD-10-CM

## 2017-11-28 NOTE — Assessment & Plan Note (Signed)
On Cogentin and Abilify

## 2017-11-28 NOTE — Assessment & Plan Note (Signed)
Inferior STEMI 11/21/17- Troponin peak 31

## 2017-11-28 NOTE — Patient Instructions (Signed)
Medication Instructions:  Your physician recommends that you continue on your current medications as directed. Please refer to the Current Medication list given to you today.   Labwork: NONE  Testing/Procedures: NONE  Follow-Up: Your physician recommends that you schedule a follow-up appointment in: 3 MONTHS   Any Other Special Instructions Will Be Listed Below (If Applicable).     If you need a refill on your cardiac medications before your next appointment, please call your pharmacy.  Thank you for choosing [facility]!    

## 2017-11-28 NOTE — Assessment & Plan Note (Signed)
Inferior STEMI 11/21/17- PDA and PLA thrombotic occlusion- treated with PDA thrombectomy and PTCA Residual small occluded PLA

## 2017-11-28 NOTE — Progress Notes (Signed)
11/28/2017 Antonio Nguyen   11-28-1970  [id number]  Primary Physician Patient, No Pcp Per Primary Cardiologist: [place]-MD TBA  HPI:  Pleasant 47 y/o Caucasian male with a history of bipolar disorder from [place], presented 11/21/17 with an inferior STEMI. The pt had thrombectomy to a PDA lesion. Residual occluded small PLA to be treated medically, no other significant CAD. His EF was 45-50%, Troponin peaked at 31. He is in the office today for post hospital f/u. From a cardiac standpoint he has been doing well, no chest pain, vague fatigue. He just recently started a job and does not yet have insurance. He was given clearance to return to work 12/05/17.    Current Outpatient Medications  Medication Sig Dispense Refill  . ARIPiprazole (ABILIFY) 10 MG tablet Take 10 mg by mouth at bedtime.    Marland Kitchen aspirin 81 MG chewable tablet Chew 1 tablet (81 mg total) by mouth daily.    Marland Kitchen atorvastatin (LIPITOR) 80 MG tablet Take 1 tablet (80 mg total) by mouth daily at 6 PM. 90 tablet 1  . benztropine (COGENTIN) 1 MG tablet Take 1 mg by mouth 2 (two) times daily as needed (muscle tension/shaking/jitters).    . clopidogrel (PLAVIX) 75 MG tablet Take 1 tablet (75 mg total) by mouth daily with breakfast. 90 tablet 2  . metoprolol tartrate (LOPRESSOR) 25 MG tablet Take 0.5 tablets (12.5 mg total) by mouth 2 (two) times daily. 60 tablet 1  . nitroGLYCERIN (NITROSTAT) 0.4 MG SL tablet Place 1 tablet (0.4 mg total) under the tongue every 5 (five) minutes as needed. 25 tablet 2  . pantoprazole (PROTONIX) 40 MG tablet Take 40 mg by mouth daily before supper.     No current facility-administered medications for this visit.    Facility-Administered Medications Ordered in Other Visits  Medication Dose Route Frequency Provider Last Rate Last Dose  . 0.9 %  sodium chloride infusion    Continuous PRN [place], Peter M, MD 100 mL/hr at 11/21/17 1610 100 mL/hr at 11/21/17 1610  . clopidogrel (PLAVIX) tablet    PRN [place],  Peter M, MD   600 mg at 11/21/17 1606  . heparin infusion 2 units/mL in 0.9 % sodium chloride    Continuous PRN [place], Peter M, MD   500 mL at 11/21/17 1537  . heparin injection    PRN [place], Peter M, MD   3,000 Units at 11/21/17 1551  . iopamidol (ISOVUE-370) 76 % injection    PRN [place], Peter M, MD   110 mL at 11/21/17 1613  . lidocaine (PF) (XYLOCAINE) 1 % injection    PRN [place], Peter M, MD   2 mL at 11/21/17 1533  . norepinephrine (LEVOPHED) 4 mg in dextrose 5 % 250 mL (0.016 mg/mL) infusion    Continuous PRN [place], Peter M, MD   Stopped at 11/21/17 1610  . Radial Cocktail/Verapamil only    PRN [place], Peter M, MD   10 mL at 11/21/17 1534  . tirofiban (AGGRASTAT) bolus via infusion    PRN [place], Peter M, MD   [phone] mcg at 11/21/17 1547  . tirofiban (AGGRASTAT) infusion 50 mcg/mL 250 mL    Continuous PRN [place], Peter M, MD 16.3 mL/hr at 11/21/17 1552 0.15 mcg/kg/min at 11/21/17 1552    No Known Allergies  Past Medical History:  Diagnosis Date  . Bipolar disorder (HCC)   . Hyperlipidemia   . Hypertension   . STEMI (ST elevation myocardial infarction) (HCC)    11/22/17 PTCA  and thrombectomy to the PDA, PLOM branch. EF 45-50%    Social History   Socioeconomic History  . Marital status: Single    Spouse name: Not on file  . Number of children: Not on file  . Years of education: Not on file  . Highest education level: Not on file  Occupational History  . Not on file  Social Needs  . Financial resource strain: Not hard at all  . Food insecurity:    Worry: Patient refused    Inability: Patient refused  . Transportation needs:    Medical: Patient refused    Non-medical: Patient refused  Tobacco Use  . Smoking status: Former Smoker    Packs/day: 1.00    Years: 20.00    Pack years: 20.00    Types: Cigarettes  . Smokeless tobacco: Never Used  . Tobacco comment: already quit smoking  Substance and Sexual Activity  . Alcohol use: Never    Frequency: Never  . Drug  use: Never  . Sexual activity: Not on file  Lifestyle  . Physical activity:    Days per week: 1 day    Minutes per session: 50 min  . Stress: Very much  Relationships  . Social connections:    Talks on phone: Twice a week    Gets together: Twice a week    Attends religious service: Never    Active member of club or organization: No    Attends meetings of clubs or organizations: Never    Relationship status: Divorced  . Intimate partner violence:    Fear of current or ex partner: No    Emotionally abused: No    Physically abused: No    Forced sexual activity: No  Other Topics Concern  . Not on file  Social History Narrative  . Not on file     Family History  Problem Relation Age of Onset  . Hypertension Father      Review of Systems: General: negative for chills, fever, night sweats or weight changes.  Cardiovascular: negative for chest pain, dyspnea on exertion, edema, orthopnea, palpitations, paroxysmal nocturnal dyspnea or shortness of breath Dermatological: negative for rash Respiratory: negative for cough or wheezing Urologic: negative for hematuria Abdominal: negative for nausea, vomiting, diarrhea, bright red blood per rectum, melena, or hematemesis Neurologic: negative for visual changes, syncope, or dizziness Numbness and shooting pain in his legs after standing for prolonged periods All other systems reviewed and are otherwise negative except as noted above.    Blood pressure 122/78, pulse 77, height 6\' 3"  (1.905 m), weight 207 lb (93.9 kg), SpO2 99 %.  General appearance: alert, cooperative, appears older than stated age and no distress Neck: no carotid bruit and no JVD Lungs: clear to auscultation bilaterally Heart: regular rate and rhythm Extremities: no edema Pulses: 2+ and symmetric Skin: Skin color, texture, turgor normal. No rashes or lesions Neurologic: Grossly normal, LE strength equal   ASSESSMENT AND PLAN:   STEMI involving right coronary  artery (HCC) Inferior STEMI 11/21/17- Troponin peak 31  CAD S/P percutaneous coronary angioplasty Inferior STEMI 11/21/17- PDA and PLA thrombotic occlusion- treated with PDA thrombectomy and PTCA Residual small occluded PLA  Ischemic cardiomyopathy EF 45-50% post MI  Dyslipidemia, goal LDL below 70 LDL 112 on presentation- high dose statin Rx initiated   History of bipolar disorder On Cogentin and Abilify   PLAN  Same Rx. F/U with cardiologist in 3 months, check LFTS and lipids then. I encouraged him to try and find  [place], apparently his former PCP retired.   Corine ShelterLuke Cabe Lashley PA-C 11/28/2017 1:16 PM

## 2017-11-28 NOTE — Assessment & Plan Note (Signed)
EF 45-50% post MI 

## 2017-11-28 NOTE — Assessment & Plan Note (Signed)
LDL 112 on presentation- high dose statin Rx initiated

## 2018-01-24 ENCOUNTER — Ambulatory Visit (INDEPENDENT_AMBULATORY_CARE_PROVIDER_SITE_OTHER): Payer: BLUE CROSS/BLUE SHIELD | Admitting: Cardiology

## 2018-01-24 ENCOUNTER — Encounter: Payer: Self-pay | Admitting: Cardiology

## 2018-01-24 VITALS — BP 115/74 | HR 72 | Ht 75.0 in | Wt 202.0 lb

## 2018-01-24 DIAGNOSIS — I2111 ST elevation (STEMI) myocardial infarction involving right coronary artery: Secondary | ICD-10-CM | POA: Diagnosis not present

## 2018-01-24 DIAGNOSIS — Z8659 Personal history of other mental and behavioral disorders: Secondary | ICD-10-CM

## 2018-01-24 DIAGNOSIS — Z9861 Coronary angioplasty status: Secondary | ICD-10-CM

## 2018-01-24 DIAGNOSIS — R079 Chest pain, unspecified: Secondary | ICD-10-CM

## 2018-01-24 DIAGNOSIS — E785 Hyperlipidemia, unspecified: Secondary | ICD-10-CM | POA: Diagnosis not present

## 2018-01-24 DIAGNOSIS — I255 Ischemic cardiomyopathy: Secondary | ICD-10-CM

## 2018-01-24 DIAGNOSIS — I2510 Atherosclerotic heart disease of native coronary artery without angina pectoris: Secondary | ICD-10-CM

## 2018-01-24 NOTE — Patient Instructions (Addendum)
Medication Instructions:  Your physician recommends that you continue on your current medications as directed. Please refer to the Current Medication list given to you today.  If you need a refill on your cardiac medications before your next appointment, please call your pharmacy.  Labwork: NONE  Lab [employer] in our office  Testing/Procedures: NONE   Follow-Up: FOLLOW UP WITH DR MCDOWELL AS SCHEDULED   Any Other Special Instructions Will Be Listed Below (If Applicable).

## 2018-01-24 NOTE — Progress Notes (Signed)
01/24/2018 Antonio Nguyen   Feb 23, 1971  [id number]  Primary Physician Patient, No Pcp Per Primary Cardiologist: Dr Diona Browner  HPI:  47 y/o Caucasian male with a history of bipolar disorder from [place], he works for [employer] in [place]. He presented 11/21/17 with an inferior STEMI. The pt had thrombectomy to a PDA lesion. He had a residual occluded small PLA to be treated medically, no other significant CAD. His EF was 45-50%, Troponin peaked at 31. He was seen in follow up 11/28/17 and was doing well. From a cardiac standpoint he had been doing well until last month when he noted Lt sided chest pain. He was seen in the ED at [facility] on 01/01/18. He was kept overnight and then discharged. His Troponin was negative. He did tell me he goes to the gym and is able to walk a mile on the treadmill without chest pain or SOB. His history is a little vague but he thinks his symptoms are similar to his pre PCI symptoms- chest discomfort and fatigue.    Current Outpatient Medications  Medication Sig Dispense Refill  . ARIPiprazole (ABILIFY) 10 MG tablet Take 10 mg by mouth at bedtime.    Marland Kitchen aspirin 81 MG chewable tablet Chew 1 tablet (81 mg total) by mouth daily.    Marland Kitchen atorvastatin (LIPITOR) 80 MG tablet Take 1 tablet (80 mg total) by mouth daily at 6 PM. 90 tablet 1  . benztropine (COGENTIN) 1 MG tablet Take 1 mg by mouth 2 (two) times daily as needed (muscle tension/shaking/jitters).    . clopidogrel (PLAVIX) 75 MG tablet Take 1 tablet (75 mg total) by mouth daily with breakfast. 90 tablet 2  . metoprolol tartrate (LOPRESSOR) 25 MG tablet Take 0.5 tablets (12.5 mg total) by mouth 2 (two) times daily. 60 tablet 1  . nitroGLYCERIN (NITROSTAT) 0.4 MG SL tablet Place 1 tablet (0.4 mg total) under the tongue every 5 (five) minutes as needed. 25 tablet 2  . pantoprazole (PROTONIX) 40 MG tablet Take 40 mg by mouth daily before supper.     No current facility-administered medications for this visit.     Facility-Administered Medications Ordered in Other Visits  Medication Dose Route Frequency Provider Last Rate Last Dose  . 0.9 %  sodium chloride infusion    Continuous PRN Sw[place], Peter M, MD 100 mL/hr at 11/21/17 1610 100 mL/hr at 11/21/17 1610  . clopidogrel (PLAVIX) tablet    PRN [place], Peter M, MD   600 mg at 11/21/17 1606  . heparin infusion 2 units/mL in 0.9 % sodium chloride    Continuous PRN [place], Peter M, MD   500 mL at 11/21/17 1537  . heparin injection    PRN [place], Peter M, MD   3,000 Units at 11/21/17 1551  . iopamidol (ISOVUE-370) 76 % injection    PRN [place], Peter M, MD   110 mL at 11/21/17 1613  . lidocaine (PF) (XYLOCAINE) 1 % injection    PRN [place], Peter M, MD   2 mL at 11/21/17 1533  . norepinephrine (LEVOPHED) 4 mg in dextrose 5 % 250 mL (0.016 mg/mL) infusion    Continuous PRN [place], Peter M, MD   Stopped at 11/21/17 1610  . Radial Cocktail/Verapamil only    PRN [place], Peter M, MD   10 mL at 11/21/17 1534  . tirofiban (AGGRASTAT) bolus via infusion    PRN [place], Peter M, MD   [id number] mcg at 11/21/17 1547  . tirofiban (AGGRASTAT) infusion 50  mcg/mL 250 mL    Continuous PRN [place], Peter M, MD 16.3 mL/hr at 11/21/17 1552 0.15 mcg/kg/min at 11/21/17 1552    No Known Allergies  Past Medical History:  Diagnosis Date  . Bipolar disorder (HCC)   . Hyperlipidemia   . Hypertension   . STEMI (ST elevation myocardial infarction) (HCC)    11/22/17 PTCA and thrombectomy to the PDA, PLOM branch. EF 45-50%    Social History   Socioeconomic History  . Marital status: Single    Spouse name: Not on file  . Number of children: Not on file  . Years of education: Not on file  . Highest education level: Not on file  Occupational History  . Not on file  Social Needs  . Financial resource strain: Not hard at all  . Food insecurity:    Worry: Patient refused    Inability: Patient refused  . Transportation needs:    Medical: Patient refused    Non-medical: Patient  refused  Tobacco Use  . Smoking status: Former Smoker    Packs/day: 1.00    Years: 20.00    Pack years: 20.00    Types: Cigarettes  . Smokeless tobacco: Never Used  . Tobacco comment: already quit smoking  Substance and Sexual Activity  . Alcohol use: Never    Frequency: Never  . Drug use: Never  . Sexual activity: Not on file  Lifestyle  . Physical activity:    Days per week: 1 day    Minutes per session: 50 min  . Stress: Very much  Relationships  . Social connections:    Talks on phone: Twice a week    Gets together: Twice a week    Attends religious service: Never    Active member of club or organization: No    Attends meetings of clubs or organizations: Never    Relationship status: Divorced  . Intimate partner violence:    Fear of current or ex partner: No    Emotionally abused: No    Physically abused: No    Forced sexual activity: No  Other Topics Concern  . Not on file  Social History Narrative  . Not on file     Family History  Problem Relation Age of Onset  . Hypertension Father      Review of Systems: General: negative for chills, fever, night sweats or weight changes.  Cardiovascular: negative for dyspnea on exertion, edema, orthopnea, palpitations, paroxysmal nocturnal dyspnea or shortness of breath Dermatological: negative for rash Respiratory: negative for cough or wheezing Urologic: negative for hematuria Abdominal: negative for nausea, vomiting, diarrhea, bright red blood per rectum, melena, or hematemesis Neurologic: negative for visual changes, syncope, or dizziness All other systems reviewed and are otherwise negative except as noted above.    Blood pressure 115/74, pulse 72, height 6\' 3"  (1.905 m), weight 202 lb (91.6 kg).  General appearance: alert, cooperative and no distress Neck: no carotid bruit and no JVD Lungs: clear to auscultation bilaterally Heart: regular rate and rhythm Extremities: no edema Skin: Skin color, texture,  turgor normal. No rashes or lesions Neurologic: Grossly normal  EKG NSR inferior Qs and TWI  ASSESSMENT AND PLAN:   Chest pain- I reviewed his cath diagram. I don't think his symptoms are anginal.  STEMI involving right coronary artery (HCC) Inferior STEMI 11/21/17- Troponin peak 31  CAD S/P percutaneous coronary angioplasty Inferior STEMI 11/21/17- PDA and PLA thrombotic occlusion- treated with PDA thrombectomy and PTCA Residual small occluded PLA  Ischemic  cardiomyopathy EF 45-50% post MI  Dyslipidemia, goal LDL below 70 LDL 112 on presentation- high dose statin Rx initiated   History of bipolar disorder On Cogentin and Abilify  PLAN Reassurance. Keep f/u with Dr Diona Browner as scheduled. He will contact us if his symptoms change.   Corine Shelter PA-C 01/24/2018 9:18 AM

## 2018-01-25 ENCOUNTER — Emergency Department (HOSPITAL_COMMUNITY): Payer: BLUE CROSS/BLUE SHIELD

## 2018-01-25 ENCOUNTER — Encounter (HOSPITAL_COMMUNITY): Payer: Self-pay | Admitting: Emergency Medicine

## 2018-01-25 ENCOUNTER — Observation Stay (HOSPITAL_COMMUNITY)
Admission: EM | Admit: 2018-01-25 | Discharge: 2018-01-26 | Disposition: A | Discharge status: Home / Self Care | Payer: BLUE CROSS/BLUE SHIELD | Attending: Interventional Cardiology | Admitting: Interventional Cardiology

## 2018-01-25 ENCOUNTER — Other Ambulatory Visit: Payer: Self-pay

## 2018-01-25 DIAGNOSIS — R0789 Other chest pain: Principal | ICD-10-CM | POA: Diagnosis present

## 2018-01-25 DIAGNOSIS — F319 Bipolar disorder, unspecified: Secondary | ICD-10-CM | POA: Diagnosis not present

## 2018-01-25 DIAGNOSIS — I2510 Atherosclerotic heart disease of native coronary artery without angina pectoris: Secondary | ICD-10-CM | POA: Diagnosis not present

## 2018-01-25 DIAGNOSIS — I5021 Acute systolic (congestive) heart failure: Secondary | ICD-10-CM | POA: Diagnosis not present

## 2018-01-25 DIAGNOSIS — Z8659 Personal history of other mental and behavioral disorders: Secondary | ICD-10-CM

## 2018-01-25 DIAGNOSIS — I255 Ischemic cardiomyopathy: Secondary | ICD-10-CM | POA: Diagnosis not present

## 2018-01-25 DIAGNOSIS — Z87891 Personal history of nicotine dependence: Secondary | ICD-10-CM | POA: Insufficient documentation

## 2018-01-25 DIAGNOSIS — Z9861 Coronary angioplasty status: Secondary | ICD-10-CM

## 2018-01-25 DIAGNOSIS — R918 Other nonspecific abnormal finding of lung field: Secondary | ICD-10-CM | POA: Insufficient documentation

## 2018-01-25 DIAGNOSIS — R079 Chest pain, unspecified: Secondary | ICD-10-CM

## 2018-01-25 DIAGNOSIS — Z7982 Long term (current) use of aspirin: Secondary | ICD-10-CM | POA: Diagnosis not present

## 2018-01-25 DIAGNOSIS — Z79899 Other long term (current) drug therapy: Secondary | ICD-10-CM | POA: Insufficient documentation

## 2018-01-25 DIAGNOSIS — R9389 Abnormal findings on diagnostic imaging of other specified body structures: Secondary | ICD-10-CM

## 2018-01-25 DIAGNOSIS — I25118 Atherosclerotic heart disease of native coronary artery with other forms of angina pectoris: Secondary | ICD-10-CM

## 2018-01-25 DIAGNOSIS — R5383 Other fatigue: Secondary | ICD-10-CM

## 2018-01-25 DIAGNOSIS — I110 Hypertensive heart disease with heart failure: Secondary | ICD-10-CM | POA: Insufficient documentation

## 2018-01-25 DIAGNOSIS — E785 Hyperlipidemia, unspecified: Secondary | ICD-10-CM | POA: Diagnosis not present

## 2018-01-25 LAB — HEPATIC FUNCTION PANEL
ALT: 25 U/L (ref 0–44)
AST: 20 U/L (ref 15–41)
Albumin: 3.7 g/dL (ref 3.5–5.0)
Alkaline Phosphatase: 89 U/L (ref 38–126)
Bilirubin, Direct: 0.1 mg/dL (ref 0.0–0.2)
Indirect Bilirubin: 0.5 mg/dL (ref 0.3–0.9)
TOTAL PROTEIN: 6.4 g/dL — AB (ref 6.5–8.1)
Total Bilirubin: 0.6 mg/dL (ref 0.3–1.2)

## 2018-01-25 LAB — SEDIMENTATION RATE: Sed Rate: 5 mm/hr (ref 0–16)

## 2018-01-25 LAB — CBC
HCT: 41.2 % (ref 39.0–52.0)
HEMOGLOBIN: 13.3 g/dL (ref 13.0–17.0)
MCH: 28.5 pg (ref 26.0–34.0)
MCHC: 32.3 g/dL (ref 30.0–36.0)
MCV: 88.2 fL (ref 78.0–100.0)
Platelets: 298 10*3/uL (ref 150–400)
RBC: 4.67 MIL/uL (ref 4.22–5.81)
RDW: 13.3 % (ref 11.5–15.5)
WBC: 7.2 10*3/uL (ref 4.0–10.5)

## 2018-01-25 LAB — BASIC METABOLIC PANEL
Anion gap: 9 (ref 5–15)
BUN: 8 mg/dL (ref 6–20)
CHLORIDE: 105 mmol/L (ref 98–111)
CO2: 25 mmol/L (ref 22–32)
Calcium: 9.1 mg/dL (ref 8.9–10.3)
Creatinine, Ser: 0.8 mg/dL (ref 0.61–1.24)
GFR calc Af Amer: >60 mL/min (ref >60)
GFR calc non Af Amer: >60 mL/min (ref >60)
Glucose, Bld: 101 mg/dL — ABNORMAL HIGH (ref 70–99)
POTASSIUM: 3.9 mmol/L (ref 3.5–5.1)
SODIUM: 139 mmol/L (ref 135–145)

## 2018-01-25 LAB — CBG MONITORING, ED: Glucose-Capillary: 92 mg/dL (ref 70–99)

## 2018-01-25 LAB — I-STAT TROPONIN, ED
TROPONIN I, POC: 0.02 ng/mL (ref 0.00–0.08)
TROPONIN I, POC: 0.04 ng/mL (ref 0.00–0.08)

## 2018-01-25 LAB — TROPONIN I: Troponin I: <0.03 ng/mL (ref <0.03)

## 2018-01-25 LAB — AMYLASE: Amylase: 39 U/L (ref 28–100)

## 2018-01-25 LAB — BRAIN NATRIURETIC PEPTIDE: B NATRIURETIC PEPTIDE 5: 141.7 pg/mL — AB (ref 0.0–100.0)

## 2018-01-25 LAB — C-REACTIVE PROTEIN: CRP: <0.8 mg/dL (ref <1.0)

## 2018-01-25 LAB — LIPASE, BLOOD: LIPASE: 29 U/L (ref 11–51)

## 2018-01-25 LAB — TSH: TSH: 1.627 u[IU]/mL (ref 0.350–4.500)

## 2018-01-25 MED ORDER — ASPIRIN 81 MG PO CHEW: 324.0000 mg | CHEWABLE_TABLET | Freq: Once | ORAL | Status: AC

## 2018-01-25 MED ORDER — ARIPIPRAZOLE 5 MG PO TABS
10.0000 mg | ORAL_TABLET | Freq: Every day | ORAL | Status: DC
Start: 2018-01-25 — End: 2018-01-26
  Administered 2018-01-25: 10 mg via ORAL
  Filled 2018-01-25: qty 2

## 2018-01-25 MED ORDER — METOPROLOL TARTRATE 12.5 MG HALF TABLET
12.5000 mg | ORAL_TABLET | Freq: Two times a day (BID) | ORAL | Status: DC
  Administered 2018-01-25 – 2018-01-26 (×2): 12.5 mg via ORAL
  Filled 2018-01-25 (×2): qty 1

## 2018-01-25 MED ORDER — ATORVASTATIN CALCIUM 80 MG PO TABS: 80.0000 mg | ORAL_TABLET | Freq: Every day | ORAL | Status: DC

## 2018-01-25 MED ORDER — NITROGLYCERIN 0.4 MG SL SUBL: 0.4000 mg | SUBLINGUAL_TABLET | SUBLINGUAL | Status: DC | PRN

## 2018-01-25 MED ORDER — CLOPIDOGREL BISULFATE 75 MG PO TABS
75.0000 mg | ORAL_TABLET | Freq: Every day | ORAL | Status: DC
  Administered 2018-01-26: 75 mg via ORAL
  Filled 2018-01-25: qty 1

## 2018-01-25 MED ORDER — BENZTROPINE MESYLATE 1 MG PO TABS
1.0000 mg | ORAL_TABLET | Freq: Two times a day (BID) | ORAL | Status: DC | PRN
  Filled 2018-01-25: qty 1

## 2018-01-25 MED ORDER — PANTOPRAZOLE SODIUM 40 MG PO TBEC: 40.0000 mg | DELAYED_RELEASE_TABLET | Freq: Every day | ORAL | Status: DC

## 2018-01-25 MED ORDER — ASPIRIN 81 MG PO CHEW
81.0000 mg | CHEWABLE_TABLET | Freq: Every day | ORAL | Status: DC
  Administered 2018-01-26: 81 mg via ORAL
  Filled 2018-01-25: qty 1

## 2018-01-25 NOTE — ED Notes (Signed)
Family at bedside. 

## 2018-01-25 NOTE — H&P (Addendum)
Cardiology Admission History and Physical:   Patient ID: Antonio Nguyen MRN: [id number]; DOB: 1971/01/17   Admission date: 01/25/2018  Primary Care Provider: Patient, No Pcp Per Primary Cardiologist: Rozann Lesches, MD    Chief Complaint:  Chest Pain   Patient Profile:   Antonio Nguyen is a 47 y.o. male with a hx of CAD and recent acute inferior STEMI July 2019, LV dysfunction, HTN, HLD and Bipolar disorder who is being seen today for the evaluation of chest pain, at the request of Dr. Ronnald Nian, Emergency Medicine.  History of Present Illness:   Antonio Nguyen was recently admitted on July 8,2019 for acute inferior STEMI. Emergent LHC showed 25% proximal RCA lesion and 100% 1st RPLB stenosis and a 100% RPLA stenosis. His STEMI was felt secondary to distal embolization of thrombus into the PDA and PLOM branches from a nonobstructive ulcerative plaque in the proximal RCA. This was treated with successful reperfusion of the PDA w/ aspiration thrombectomy and PTCA. No stents were placed. There was no other disease. The LM, LAD and Cx were all normal. His troponin peaked at 31. He was treated with Aggrastat x 18 hrs and placed on DAPT w/ ASA and Plavix, with recommendations to continue for 12 months. He was also treated with high intensity statin and BB. Echo showed mild LV dysfunction with EF at 45-50% with hypokinesis of the inferior and inferoseptal myocardium. Right ventricular systolic function was moderately to severely reduced. No valvular dysfunction. Pt was discharged home in stable condition w/o CP on 11/24/17.   He was seen for post hospital f/u on 11/28/17 and was doing well w/o any recurrent symptoms. Pt apparently was doing well until 01/01/18 when he had to go to the ED at [facility] due to left sided chest pain. He was apparently kept overnight for r/o and then discharged. Pt reports he was told that all of his blood work was normal. He had post ED f/u in our office yesterday and was seen by  Loma Boston, PA. Based on his documentation, he felt like his CP was noncardiac and recommend he f/u with Dr. Domenic Polite as planned.   Pt now in the [facility] ED with complaint of recurrent CP. Pt is a poor historian and very hard to get a detailed history from him. He states his pain feels similar to his pain when he had his MI but the symptoms are very atypical. He notes left sided chest pain, described as sharp but sometimes pressure like. Can last several hours. Better with slow shallow breathing. Also better after he eats. Not particularly worse with exertion.  I palpated his chest wall and he noted some tenderness/ reproduction of pain in left chest.   In the ED, vital signs are stable. POC troponin negative x 2. When he had his MI in July, his troponin peaked at 63. EKG shows SR, old inferior Q waves incomplete RBBB. CXR shows no CHF nor other acute cardiopulmonary abnormality. However, per radiologist interpretation, there is a stable density projecting over the junction of the right first rib anteriorly with the sternum. An apical lordotic view would be useful when the patient can undergo the procedure in an effort to judge whether this reflects a bony finding or if there is a true nodule in the right upper lung. He is a former smoker. Quit 3 years ago. He denies any right sided CP. No dyspnea.   He reports full compliance with his cardiac meds.   Past Medical History:  Diagnosis Date  .  Bipolar disorder ([place])   . Hyperlipidemia   . Hypertension   . STEMI (ST elevation myocardial infarction) ([place])    11/22/17 PTCA and thrombectomy to the PDA, PLOM branch. EF 45-50%    Past Surgical History:  Procedure Laterality Date  . CORONARY BALLOON ANGIOPLASTY N/A 11/21/2017   Procedure: CORONARY BALLOON ANGIOPLASTY;  Surgeon: Martinique, Peter M, MD;  Location: [place] [facility];  Service: Cardiovascular;  Laterality: N/A;  . CORONARY THROMBECTOMY N/A 11/21/2017   Procedure: Coronary Thrombectomy;  Surgeon:  Martinique, Peter M, MD;  Location: [place] [facility];  Service: Cardiovascular;  Laterality: N/A;  . LEFT HEART CATH AND CORONARY ANGIOGRAPHY N/A 11/21/2017   Procedure: LEFT HEART CATH AND CORONARY ANGIOGRAPHY;  Surgeon: Martinique, Peter M, MD;  Location: [place] [facility];  Service: Cardiovascular;  Laterality: N/A;     Medications Prior to Admission: Prior to Admission medications   Medication Sig Start Date End Date Taking? Authorizing Provider  ARIPiprazole (ABILIFY) 10 MG tablet Take 10 mg by mouth at bedtime.   Yes [provider]  aspirin 81 MG chewable tablet Chew 1 tablet (81 mg total) by mouth daily. 11/25/17  Yes Cheryln Manly, NP  atorvastatin (LIPITOR) 80 MG tablet Take 1 tablet (80 mg total) by mouth daily at 6 PM. 11/24/17  Yes Cheryln Manly, NP  benztropine (COGENTIN) 1 MG tablet Take 1 mg by mouth 2 (two) times daily as needed (muscle tension/shaking/jitters).   Yes [provider]  clopidogrel (PLAVIX) 75 MG tablet Take 1 tablet (75 mg total) by mouth daily with breakfast. 11/25/17  Yes Reino Bellis B, NP  metoprolol tartrate (LOPRESSOR) 25 MG tablet Take 0.5 tablets (12.5 mg total) by mouth 2 (two) times daily. 11/24/17  Yes Cheryln Manly, NP  nitroGLYCERIN (NITROSTAT) 0.4 MG SL tablet Place 1 tablet (0.4 mg total) under the tongue every 5 (five) minutes as needed. 11/24/17  Yes Reino Bellis B, NP  pantoprazole (PROTONIX) 40 MG tablet Take 40 mg by mouth daily before supper.   Yes [provider]     Allergies:   No Known Allergies  Social History:   Social History   Socioeconomic History  . Marital status: Single    Spouse name: Not on file  . Number of children: Not on file  . Years of education: Not on file  . Highest education level: Not on file  Occupational History  . Not on file  Social Needs  . Financial resource strain: Not hard at all  . Food insecurity:    Worry: Patient refused    Inability: Patient refused  .  Transportation needs:    Medical: Patient refused    Non-medical: Patient refused  Tobacco Use  . Smoking status: Former Smoker    Packs/day: 1.00    Years: 20.00    Pack years: 20.00    Types: Cigarettes  . Smokeless tobacco: Never Used  . Tobacco comment: already quit smoking  Substance and Sexual Activity  . Alcohol use: Never    Frequency: Never  . Drug use: Never  . Sexual activity: Not on file  Lifestyle  . Physical activity:    Days per week: 1 day    Minutes per session: 50 min  . Stress: Very much  Relationships  . Social connections:    Talks on phone: Twice a week    Gets together: Twice a week    Attends religious service: Never    Active member of club  or organization: No    Attends meetings of clubs or organizations: Never    Relationship status: Divorced  . Intimate partner violence:    Fear of current or ex partner: No    Emotionally abused: No    Physically abused: No    Forced sexual activity: No  Other Topics Concern  . Not on file  Social History Narrative  . Not on file    Family History:   The patient's family history includes Hypertension in his father.    ROS:  Please see the history of present illness.  All other ROS reviewed and negative.     Physical Exam/Data:   Vitals:   01/25/18 1400 01/25/18 1415 01/25/18 1419 01/25/18 1428  BP: (!) 101/54     Pulse: 70 89 82 66  Resp: _0 Temp:      TempSrc:      SpO2: 100% 98% 100% 100%  Weight:      Height:       No intake or output data in the 24 hours ending 01/25/18 1536 Filed Weights   01/25/18 0932  Weight: 91.6 kg   Body mass index is 25.25 kg/m.  General:  Well nourished, well developed, in no acute distress HEENT: normal Lymph: no adenopathy Neck: noJVD Endocrine:  No thryomegaly Vascular: No carotid bruits; FA pulses 2+ bilaterally without bruits  Cardiac:  normal S1, S2; RRR; no murmur  Lungs:  clear to auscultation bilaterally, no wheezing, rhonchi or rales    Abd: soft, nontender, no hepatomegaly  Ext: no edema Musculoskeletal:  No deformities, BUE and BLE strength normal and equal Skin: warm and dry  Neuro:  CNs 2-12 intact, no focal abnormalities noted Psych:  Normal affect   EKG:  The EKG was personally reviewed and demonstrates:  SR, incomplete RBBB, old inferior Q waves, no acute ST changes Telemetry:  Telemetry was personally reviewed and demonstrates:  NSR  Relevant CV Studies: Procedures   LHC 11/21/17 Coronary Thrombectomy  CORONARY BALLOON ANGIOPLASTY  LEFT HEART CATH AND CORONARY ANGIOGRAPHY  Conclusion     Prox RCA lesion is 25% stenosed.  1st RPLB lesion is 100% stenosed.  RPDA lesion is 100% stenosed.  Balloon angioplasty was performed using a BALLOON EMERGE MR 2.0X12.  Post intervention, there is a 0% residual stenosis.  LV end diastolic pressure is normal.  1. Acute inferior STEMI secondary to distal embolization of thrombus into the PDA and PLOM branches from a nonobstructive ulcerative plaque in the proximal RCA 2. Normal LVEDP 3. LV gram not performed due to high creatinine 4. Successful reperfusion of the PDA with aspiration thrombectomy and PTCA   Coronary Diagrams   Diagnostic Diagram       Post-Intervention Diagram        ________________________  2D Echo 11/22/17  Study Conclusions  - Left ventricle: The cavity size was normal. Wall thickness was normal. Systolic function was mildly reduced. The estimated ejection fraction was in the range of 45% to 50%. Hypokinesis of the inferior and inferoseptal myocardium. - Right ventricle: The cavity size was mildly dilated. Wall thickness was normal. Systolic function was moderately to severely reduced.  Impressions:  - Focal wall motion abnormalities in inferior and inferoseptal walls with overall mild decrease in LV ejection fraction. RV systolic function severely reduced, best seen in  subcostal windows.   Laboratory Data:  Chemistry Recent Labs  Lab 01/25/18 0928  NA 139  K 3.9  CL 105  CO2 25  GLUCOSE 101*  BUN 8  CREATININE 0.80  CALCIUM 9.1  GFRNONAA >60  GFRAA >60  ANIONGAP 9    No results for input(s): PROT, ALBUMIN, AST, ALT, ALKPHOS, BILITOT in the last 168 hours. Hematology Recent Labs  Lab 01/25/18 0928  WBC 7.2  RBC 4.67  HGB 13.3  HCT 41.2  MCV 88.2  MCH 28.5  MCHC 32.3  RDW 13.3  PLT 298   Cardiac EnzymesNo results for input(s): TROPONINI in the last 168 hours.  Recent Labs  Lab 01/25/18 0957 01/25/18 1427  TROPIPOC 0.02 0.04    BNP Recent Labs  Lab 01/25/18 0931  BNP 141.7*    DDimer No results for input(s): DDIMER in the last 168 hours.  Radiology/Studies:  Dg Chest 2 View  Result Date: 01/25/2018 CLINICAL DATA:  Sudden onset of chest pain without other symptoms. History of myocardial infarction 3 weeks ago. EXAM: CHEST - 2 VIEW COMPARISON:  Portable chest x-ray of January 25, 2018 at DYN 30 3 a.m. FINDINGS: The lungs are well-expanded and clear. There is stable soft tissue density that projects in the region of the right first costo sternal junction. The heart and mediastinal structures are normal. The trachea is midline. There is no pleural effusion. The bony thorax exhibits no acute abnormality. IMPRESSION: There is no CHF nor other acute cardiopulmonary abnormality. Stable density projecting over the junction of the right first rib anteriorly with the sternum. An apical lordotic view would be useful when the patient can undergo the procedure in an effort to judge whether this reflects a bony finding or if there is a true nodule in the right upper lung. Electronically Signed   By: David  Martinique M.D.   On: 01/25/2018 12:19   Dg Chest Portable 1 View  Result Date: 01/25/2018 CLINICAL DATA:  47 year old presenting with acute onset of chest pain. Recent MI. EXAM: PORTABLE CHEST 1 VIEW COMPARISON:  None. FINDINGS: Cardiac  silhouette mildly enlarged allowing for AP portable technique. Apparent nodular opacity in the RIGHT UPPER LOBE may reflect calcification in the ANTERIOR first costal cartilage. Streaky opacities at the LEFT lung base. Mild linear atelectasis at the RIGHT lung base. Lungs otherwise clear. Pulmonary vascularity normal. IMPRESSION: 1. LEFT basilar atelectasis and/or bronchopneumonia. Mild RIGHT basilar atelectasis. 2. Mild cardiomegaly without evidence of pulmonary edema. 3. Apparent nodular opacity in the RIGHT upper lobe may reflect calcification in the ANTERIOR first costal cartilage. PA and LATERAL chest x-ray and an apical lordotic chest x-ray is recommended when the patient is clinically stable. Electronically Signed   By: Evangeline Dakin M.D.   On: 01/25/2018 10:07    Assessment and Plan:   Antonio Nguyen is a 47 y.o. male with a hx of CAD and recent acute inferior STEMI July 2019, LV dysfunction, HTN, HLD and Bipolar disorder who is being seen today for the evaluation of chest pain, at the request of Dr. Ronnald Nian, Emergency Medicine.  1. Chest Pain: very atypical pattern and not c/w with cardiac chest pain, although he states his pain is similar to his recent MI pain. He did have a recent inferior MI in July secondary to distal embolization of thrombus into the PDA and PLOM branches from a nonobstructive ulcerative plaque in the proximal RCA. This was treated with successful reperfusion of the PDA w/ aspiration thrombectomy and PTCA. No stents were placed. There was no other disease. The LM, LAD and Cx were all normal. His troponin peaked at 31. Echo showed mild LV dysfunction with EF  at 45-50%. He was evaluated at [facility] for CP 2 weeks ago and was admitted overnight for r/o but w/u unremarkable. He was seen in our cardiology clinic yesterday for ED f/u and his CP was felt noncardiac by provider. Today in the ED, his w/u is negative. POC troponin is negative x 2. EKG w/o any acute ST changes. CBC  and BMP unremarkable. CXR shows stable density projecting over the junction of the right first rib anteriorly with the sternum, but this is not in the area of his pain (left chest). I doubt his CP is cardiac, however given his symptoms feel similar to MI pain, we will admit for r/o. I've discussed plan w/ Dr. Irish Lack. Will continue to cycle enzymes. Also ? GI etiology, thyroid, inflammation. Will also check amylase, lipase, TSH and CRP and ESR.  2. CAD: per above. Continue ASA, Plavix, statin and BB.    Severity of Illness: The appropriate patient status for this patient is OBSERVATION. Observation status is judged to be reasonable and necessary in order to provide the required intensity of service to ensure the patient's safety. The patient's presenting symptoms, physical exam findings, and initial radiographic and laboratory data in the context of their medical condition is felt to place them at decreased risk for further clinical deterioration. Furthermore, it is anticipated that the patient will be medically stable for discharge from the hospital within 2 midnights of admission. The following factors support the patient status of observation.   " The patient's presenting symptoms include chest pain. " The physical exam findings include abnormal EKG. " The initial radiographic and laboratory data are abnormal EKG.     For questions or updates, please contact [place] Please consult [email or web address] for contact info under        Signed, Lyda Jester, PA-C  01/25/2018 3:36 PM   I have examined the patient and reviewed assessment and plan and discussed with patient.  Agree with above as stated.  Pain is persistent and some features typical of his prior angina.  THus far, w/u has been negative and he is somewhat of a vague historian.  I think this is likely noncardiac, but will cycle enzymes to be sure.  ECG while he was having pain was reassuring.  Chest pain seems a little bit late  for autoimmune pericarditis.   WIl check ESR>   Would also check TSH, LFTs , amylase and lipase.   Larae Grooms

## 2018-01-25 NOTE — ED Notes (Signed)
Pt CBG 92.  

## 2018-01-25 NOTE — ED Notes (Signed)
Patient ambulatory to bathroom with steady gait at this time 

## 2018-01-25 NOTE — ED Provider Notes (Signed)
[facility] EMERGENCY DEPARTMENT Provider Note   CSN: [id number] Arrival date & time: 01/25/18  6734     History   Chief Complaint Chief Complaint  Patient presents with  . Chest Pain    HPI Antonio Nguyen is a 47 y.o. male.  The history is provided by the patient.  Chest Pain   This is a recurrent problem. The current episode started 1 to 2 hours ago. The problem occurs every several days. The problem has been gradually improving. The pain is present in the substernal region. The pain is at a severity of 3/10. The pain is mild. The quality of the pain is described as brief. The pain does not radiate. Pertinent negatives include no abdominal pain, no back pain, no cough, no diaphoresis, no exertional chest pressure, no fever, no irregular heartbeat, no leg pain, no lower extremity edema, no malaise/fatigue, no nausea, no near-syncope, no orthopnea, no palpitations, no shortness of breath, no sputum production, no vomiting and no weakness. He has tried nitroglycerin for the symptoms. The treatment provided moderate relief.  His past medical history is significant for CAD.  Pertinent negatives for past medical history include no seizures.  Procedure history is positive for cardiac catheterization (recent PCI).    Past Medical History:  Diagnosis Date  . Bipolar disorder (HCC)   . Hyperlipidemia   . Hypertension   . STEMI (ST elevation myocardial infarction) (HCC)    11/22/17 PTCA and thrombectomy to the PDA, PLOM branch. EF 45-50%    Patient Active Problem List   Diagnosis Date Noted  . Chest pain in adult 01/24/2018  . CAD S/P percutaneous coronary angioplasty 11/28/2017  . History of bipolar disorder 11/28/2017  . Ischemic cardiomyopathy 11/28/2017  . Dyslipidemia, goal LDL below 70 11/24/2017  . Acute systolic heart failure (HCC) 11/24/2017  . STEMI involving right coronary artery (HCC) 11/21/2017    Past Surgical History:  Procedure Laterality Date  .  CORONARY BALLOON ANGIOPLASTY N/A 11/21/2017   Procedure: CORONARY BALLOON ANGIOPLASTY;  Surgeon: Swaziland, Peter M, MD;  Location[facility] INVASIVE CV LAB;  Service: Cardiovascular;  Laterality: N/A;  . CORONARY THROMBECTOMY N/A 11/21/2017   Procedure: Coronary Thrombectomy;  Surgeon: Swaziland, Peter M, MD;  Location: [facility] INVASIVE CV LAB;  Service: Cardiovascular;  Laterality: N/A;  . LEFT HEART CATH AND CORONARY ANGIOGRAPHY N/A 11/21/2017   Procedure: LEFT HEART CATH AND CORONARY ANGIOGRAPHY;  Surgeon: Swaziland, Peter M, MD;  Location: [facility] INVASIVE CV LAB;  Service: Cardiovascular;  Laterality: N/A;        Home Medications    Prior to Admission medications   Medication Sig Start Date End Date Taking? Authorizing Provider  ARIPiprazole (ABILIFY) 10 MG tablet Take 10 mg by mouth at bedtime.   Yes [provider]  aspirin 81 MG chewable tablet Chew 1 tablet (81 mg total) by mouth daily. 11/25/17  Yes Arty Baumgartner, NP  atorvastatin (LIPITOR) 80 MG tablet Take 1 tablet (80 mg total) by mouth daily at 6 PM. 11/24/17  Yes Arty Baumgartner, NP  benztropine (COGENTIN) 1 MG tablet Take 1 mg by mouth 2 (two) times daily as needed (muscle tension/shaking/jitters).   Yes [provider]  clopidogrel (PLAVIX) 75 MG tablet Take 1 tablet (75 mg total) by mouth daily with breakfast. 11/25/17  Yes Laverda Page B, NP  metoprolol tartrate (LOPRESSOR) 25 MG tablet Take 0.5 tablets (12.5 mg total) by mouth 2 (two) times daily. 11/24/17  Yes Arty Baumgartner, NP  nitroGLYCERIN (NITROSTAT) 0.4 MG SL tablet Place 1 tablet (0.4 mg total) under the tongue every 5 (five) minutes as needed. 11/24/17  Yes Laverda Page B, NP  pantoprazole (PROTONIX) 40 MG tablet Take 40 mg by mouth daily before supper.   Yes [provider]    Family History Family History  Problem Relation Age of Onset  . Hypertension Father     Social History Social History   Tobacco Use  . Smoking status: Former Smoker      Packs/day: 1.00    Years: 20.00    Pack years: 20.00    Types: Cigarettes  . Smokeless tobacco: Never Used  . Tobacco comment: already quit smoking  Substance Use Topics  . Alcohol use: Never    Frequency: Never  . Drug use: Never     Allergies   Patient has no known allergies.   Review of Systems Review of Systems  Constitutional: Negative for chills, diaphoresis, fever and malaise/fatigue.  HENT: Negative for ear pain and sore throat.   Eyes: Negative for pain and visual disturbance.  Respiratory: Negative for cough, sputum production and shortness of breath.   Cardiovascular: Positive for chest pain. Negative for palpitations, orthopnea and near-syncope.  Gastrointestinal: Negative for abdominal pain, nausea and vomiting.  Genitourinary: Negative for dysuria and hematuria.  Musculoskeletal: Negative for arthralgias and back pain.  Skin: Negative for color change and rash.  Neurological: Negative for seizures, syncope and weakness.  All other systems reviewed and are negative.    Physical Exam Updated Vital Signs  ED Triage Vitals  Enc Vitals Group     BP 01/25/18 0930 109/78     Pulse Rate 01/25/18 0930 66     Resp 01/25/18 0930 13     Temp 01/25/18 0930 98.6 F (37 C)     Temp Source 01/25/18 0930 Oral     SpO2 01/25/18 0930 100 %     Weight 01/25/18 0932 201 lb 15.8 oz (91.6 kg)     Height 01/25/18 0932 6\' 3"  (1.905 m)     Head Circumference --      Peak Flow --      Pain Score 01/25/18 0932 2     Pain Loc --      Pain Edu? --      Excl. in GC? --     Physical Exam  Constitutional: He appears well-developed and well-nourished.  HENT:  Head: Normocephalic and atraumatic.  Eyes: Conjunctivae are normal.  Neck: Normal range of motion. Neck supple.  Cardiovascular: Normal rate, regular rhythm, intact distal pulses and normal pulses.  No murmur heard. Pulmonary/Chest: Effort normal and breath sounds normal. No respiratory distress. He has no  decreased breath sounds. He has no wheezes.  Abdominal: Soft. There is no tenderness.  Musculoskeletal: He exhibits no edema.       Right lower leg: He exhibits no edema.       Left lower leg: He exhibits no edema.  Neurological: He is alert.  Skin: Skin is warm and dry. Capillary refill takes less than 2 seconds.  Psychiatric: He has a normal mood and affect.  Nursing note and vitals reviewed.    ED Treatments / Results  Labs (all labs ordered are listed, but only abnormal results are displayed) Labs Reviewed  BASIC METABOLIC PANEL - Abnormal; Notable for the following components:      Result Value   Glucose, Bld 101 (*)    All other components within normal limits  BRAIN  NATRIURETIC PEPTIDE - Abnormal; Notable for the following components:   B Natriuretic Peptide 141.7 (*)    All other components within normal limits  CBC  CBG MONITORING, ED  I-STAT TROPONIN, ED  I-STAT TROPONIN, ED  I-STAT TROPONIN, ED    EKG EKG Interpretation  Date/Time:  Wednesday January 25 2018 09:29:59 EDT Ventricular Rate:  66 PR Interval:    QRS Duration: 104 QT Interval:  395 QTC Calculation: 414 R Axis:   83 Text Interpretation:  Sinus rhythm RSR' in V1 or V2, right VCD or RVH Probable inferior infarct, old Confirmed by Virgina Norfolk [phone]) on 01/25/2018 9:37:58 AM   Radiology Dg Chest 2 Nguyen  Result Date: 01/25/2018 CLINICAL DATA:  Sudden onset of chest pain without other symptoms. History of myocardial infarction 3 weeks ago. EXAM: CHEST - 2 Nguyen COMPARISON:  Portable chest x-ray of January 25, 2018 at DYN 30 3 a.m. FINDINGS: The lungs are well-expanded and clear. There is stable soft tissue density that projects in the region of the right first costo sternal junction. The heart and mediastinal structures are normal. The trachea is midline. There is no pleural effusion. The bony thorax exhibits no acute abnormality. IMPRESSION: There is no CHF nor other acute cardiopulmonary  abnormality. Stable density projecting over the junction of the right first rib anteriorly with the sternum. An apical lordotic Nguyen would be useful when the patient can undergo the procedure in an effort to judge whether this reflects a bony finding or if there is a true nodule in the right upper lung. Electronically Signed   By: David  Swaziland M.D.   On: 01/25/2018 12:19   Dg Chest Portable 1 Nguyen  Result Date: 01/25/2018 CLINICAL DATA:  47 year old presenting with acute onset of chest pain. Recent MI. EXAM: PORTABLE CHEST 1 Nguyen COMPARISON:  None. FINDINGS: Cardiac silhouette mildly enlarged allowing for AP portable technique. Apparent nodular opacity in the RIGHT UPPER LOBE may reflect calcification in the ANTERIOR first costal cartilage. Streaky opacities at the LEFT lung base. Mild linear atelectasis at the RIGHT lung base. Lungs otherwise clear. Pulmonary vascularity normal. IMPRESSION: 1. LEFT basilar atelectasis and/or bronchopneumonia. Mild RIGHT basilar atelectasis. 2. Mild cardiomegaly without evidence of pulmonary edema. 3. Apparent nodular opacity in the RIGHT upper lobe may reflect calcification in the ANTERIOR first costal cartilage. PA and LATERAL chest x-ray and an apical lordotic chest x-ray is recommended when the patient is clinically stable. Electronically Signed   By: Hulan Saas M.D.   On: 01/25/2018 10:07    Procedures Procedures (including critical care time)  Medications Ordered in ED Medications  aspirin chewable tablet 324 mg (324 mg Oral Given by EMS 01/25/18 0934)     Initial Impression / Assessment and Plan / ED Course  I have reviewed the triage vital signs and the nursing notes.  Pertinent labs & imaging results that were available during my care of the patient were reviewed by me and considered in my medical decision making (see chart for details).     Teven Mittman is a 47 year old male with history of high cholesterol, hypertension, CAD status post recent  thrombectomy and balloon angioplasty who presents to the ED with chest pain.  Patient with normal vitals.  No fever.  EKG shows sinus rhythm with no signs of acute ischemia.  Patient with no cough, no sputum production.  Chest pain started over the last several hours.  Left-sided chest pain with pressure-like feeling.  Patient does not have any PE or  DVT risk factors. Doubt PE.  Patient saw his cardiologist yesterday.  He had a recent admission for chest pain at outside hospital.  Exam is overall unremarkable.  Clear breath sounds.  No signs of volume overload.  Lab work showed serial troponins negative x2.  Chest x-ray showed no signs of pneumonia, pneumothorax, pleural effusion.  No significant anemia, electrolyte abnormality, kidney injury.  Patient with improved pain.  Patient with mild elevation of BNP.  Cardiology consulted given his cardiac history and they came down to the ED to evaluate the patient.   Cardiology to admit the patient for further work-up.  Hemodynamically stable to my care.  This chart was dictated using voice recognition software.  Despite best efforts to proofread,  errors can occur which can change the documentation meaning.   Final Clinical Impressions(s) / ED Diagnoses   Final diagnoses:  Chest pain, unspecified type    ED Discharge Orders    None       Virgina Norfolk, DO 01/25/18 1600

## 2018-01-25 NOTE — ED Triage Notes (Signed)
Patient presents to ED from work with complaints of sudden onset on chest pain. Patient denies any Shortness of breath, N/V. Patient reports MI 3 weeks ago without stent placement. Patient was given 324 ASA and 1 nitro. Patient has 20 R hand placed by EMS

## 2018-01-25 NOTE — ED Notes (Signed)
Patient given food per EDP approval .

## 2018-01-25 NOTE — Consult Note (Addendum)
Entered in error. See H&P   

## 2018-01-26 ENCOUNTER — Encounter (HOSPITAL_COMMUNITY): Payer: Self-pay | Admitting: Physician Assistant

## 2018-01-26 DIAGNOSIS — R9389 Abnormal findings on diagnostic imaging of other specified body structures: Secondary | ICD-10-CM

## 2018-01-26 DIAGNOSIS — I25118 Atherosclerotic heart disease of native coronary artery with other forms of angina pectoris: Secondary | ICD-10-CM | POA: Diagnosis not present

## 2018-01-26 LAB — BASIC METABOLIC PANEL
ANION GAP: 8 (ref 5–15)
BUN: 8 mg/dL (ref 6–20)
CALCIUM: 8.7 mg/dL — AB (ref 8.9–10.3)
CO2: 23 mmol/L (ref 22–32)
Chloride: 108 mmol/L (ref 98–111)
Creatinine, Ser: 0.85 mg/dL (ref 0.61–1.24)
GFR calc non Af Amer: >60 mL/min (ref >60)
Glucose, Bld: 94 mg/dL (ref 70–99)
Potassium: 3.7 mmol/L (ref 3.5–5.1)
Sodium: 139 mmol/L (ref 135–145)

## 2018-01-26 LAB — TROPONIN I
Troponin I: <0.03 ng/mL (ref <0.03)
Troponin I: <0.03 ng/mL (ref <0.03)

## 2018-01-26 NOTE — Discharge Summary (Addendum)
Discharge Summary    Patient ID: Antonio Nguyen,  MRN: [id number], DOB/AGE: 47-Jul-1972 47 y.o.  Admit date: 01/25/2018 Discharge date: 01/26/2018  Primary Care Provider: Patient, No Pcp Per Primary Cardiologist: Rozann Lesches, MD Primary Electrophysiologist:  None  Discharge Diagnoses    Principal Problem:   Atypical chest pain Active Problems:   Dyslipidemia, goal LDL below 70   CAD S/P percutaneous coronary angioplasty   History of bipolar disorder   Ischemic cardiomyopathy   Abnormal CXR    Diagnostic Studies/Procedures  N/A _____________     History of Present Illness     Antonio Nguyen is a 47 y.o. male with history of CAD and recent acute inferior STEMI July 2019, LV dysfunction, HTN, HLD and Bipolar disorderwho was readmitted with chest pain.  Mr.Joneswas recently admitted on July 8,2019 for acute inferior STEMI. Emergent LHCshowed 25% proximal RCA lesion and 100% 1st RPLB stenosis and a 100% RPLA stenosis. His STEMI was felt secondary to distal embolization of thrombus into the PDA and PLOM branches from a nonobstructive ulcerative plaque in the proximal RCA. This was treated with successful reperfusion of the PDA w/aspiration thrombectomy and PTCA. No stents were placed. There was no other disease. The LM, LAD and Cx were all normal.His troponin peaked at 31.He was treated with Aggrastat x 18 hrs and placed on DAPT w/ ASA and Plavix, with recommendations to continue for 12 months. He was also treated with high intensity statin and BB. Echo showed mild LV dysfunction with EF at 45-50% with hypokinesis of the inferior and inferoseptal myocardium.Right ventricular systolic function was moderately to severely reduced. No valvular dysfunction. Pt was discharged home in stable condition w/o CP on 11/24/17.  He was seen for post hospital f/u on 11/28/17 and was doing well w/o any recurrent symptoms. Pt apparently was doing well until 01/01/18 when he had to go to the ED at [place] left sided chest pain. He was apparently kept overnight for r/o and then discharged. Pt reports he was told that all of his blood work was normal.He had post ED f/u in our office yesterday and was seen by Loma Boston, PA. Based on his documentation, he felt like his CP was noncardiacand recommend he f/u with Dr. Domenic Polite as planned. He returned to [facility] ER 9/11 with recurrent chest pain. It was noted he was a poor historian and very hard to get a detailed history from him. He noted left sided chest pain, described as sharp but sometimes pressure like and could last several hours. It was better with shlow shallow breathing, also better after he eats, not particularly worse with exertion,+ some discomfort with palpation. In the ED, vital signs were stable. POC troponin negative x 2. EKG showed SR, old inferior Q waves incomplete RBBB. CXR shows no CHF nor other acute cardiopulmonary abnormality. However, per radiologist interpretation, "there is a stabledensity projecting over the junction of the right first rib anteriorly with the sternum. An apical lordotic view would be useful when the patient can undergo the procedure in an effort to judge whether this reflects a bony finding or if there is a true nodule in the right upper lung."He reported full compliance with cardiac meds. He was admitted for overnight observation.  Hospital Course    Overnight labs showed normal LFTs and negative troponins x 5 total. CRP and ESR were normal, arguing against inflammatory process/pericarditis. TSH was normal. The patient is feeling better today. He will be encouraged to see his PCP  to evaluate noncardiac CP and to follow-up the CXR abnormality. Dr. Irish Lack has seen and examined the patient today and feels he is stable for discharge. We will keep f/u as scheduled with Dr Domenic Polite in 02/2018. Work note given to return 01/30/18. _____________  Discharge Vitals Blood pressure 118/83, pulse 72, temperature  98.3 F (36.8 C), temperature source Oral, resp. rate 17, height _0  (1.905 m), weight 90.5 kg, SpO2 97 %.  Filed Weights   01/25/18 0932 01/25/18 1830 01/26/18 0422  Weight: 91.6 kg 91 kg 90.5 kg    Labs & Radiologic Studies    CBC Recent Labs    01/25/18 0928  WBC 7.2  HGB 13.3  HCT 41.2  MCV 88.2  PLT 952   Basic Metabolic Panel Recent Labs    01/25/18 0928 01/26/18 0736  NA 139 139  K 3.9 3.7  CL 105 108  CO2 25 23  GLUCOSE 101* 94  BUN 8 8  CREATININE 0.80 0.85  CALCIUM 9.1 8.7*   Liver Function Tests Recent Labs    01/25/18 1845  AST 20  ALT 25  ALKPHOS 89  BILITOT 0.6  PROT 6.4*  ALBUMIN 3.7   Recent Labs    01/25/18 1845  LIPASE 29  AMYLASE 39   Cardiac Enzymes Recent Labs    01/25/18 1845 01/26/18 0112 01/26/18 0736  TROPONINI <0.03 <0.03 <0.03   Thyroid Function Tests Recent Labs    01/25/18 1845  TSH 1.627   _____________  Dg Chest 2 View  Result Date: 01/25/2018 CLINICAL DATA:  Sudden onset of chest pain without other symptoms. History of myocardial infarction 3 weeks ago. EXAM: CHEST - 2 VIEW COMPARISON:  Portable chest x-ray of January 25, 2018 at DYN 30 3 a.m. FINDINGS: The lungs are well-expanded and clear. There is stable soft tissue density that projects in the region of the right first costo sternal junction. The heart and mediastinal structures are normal. The trachea is midline. There is no pleural effusion. The bony thorax exhibits no acute abnormality. IMPRESSION: There is no CHF nor other acute cardiopulmonary abnormality. Stable density projecting over the junction of the right first rib anteriorly with the sternum. An apical lordotic view would be useful when the patient can undergo the procedure in an effort to judge whether this reflects a bony finding or if there is a true nodule in the right upper lung. Electronically Signed   By: David  Martinique M.D.   On: 01/25/2018 12:19   Dg Chest Portable 1 View  Result Date:  01/25/2018 CLINICAL DATA:  47 year old presenting with acute onset of chest pain. Recent MI. EXAM: PORTABLE CHEST 1 VIEW COMPARISON:  None. FINDINGS: Cardiac silhouette mildly enlarged allowing for AP portable technique. Apparent nodular opacity in the RIGHT UPPER LOBE may reflect calcification in the ANTERIOR first costal cartilage. Streaky opacities at the LEFT lung base. Mild linear atelectasis at the RIGHT lung base. Lungs otherwise clear. Pulmonary vascularity normal. IMPRESSION: 1. LEFT basilar atelectasis and/or bronchopneumonia. Mild RIGHT basilar atelectasis. 2. Mild cardiomegaly without evidence of pulmonary edema. 3. Apparent nodular opacity in the RIGHT upper lobe may reflect calcification in the ANTERIOR first costal cartilage. PA and LATERAL chest x-ray and an apical lordotic chest x-ray is recommended when the patient is clinically stable. Electronically Signed   By: Evangeline Dakin M.D.   On: 01/25/2018 10:07   Disposition   Pt is being discharged home today in good condition.  Follow-up Plans & Appointments    Follow-up  Information    Primary Care Follow up.   Why:  Follow up with your primary care doctor to discuss evaluation of non-heart causes of chest pain, and for a repeat chest x-ray.       Satira Sark, MD Follow up.   Specialty:  Cardiology Why:  Keep follow-up as scheduled below. Contact information: [place] [address] [phone]          Discharge Instructions    Diet - low sodium heart healthy   Complete by:  As directed    Discharge instructions   Complete by:  As directed    Your chest x-ray showed a stable density over your right first rib. It is not clear from the imaging if this is related to the bone being prominent or if you perhaps have a nodule in the right upper lung. Please discuss further imaging with primary care doctor.   Increase activity slowly   Complete by:  As directed    You may return to work on Monday  01/30/18.      Discharge Medications   Allergies as of 01/26/2018   No Known Allergies     Medication List    TAKE these medications   ARIPiprazole 10 MG tablet Commonly known as:  ABILIFY Take 10 mg by mouth at bedtime.   aspirin 81 MG chewable tablet Chew 1 tablet (81 mg total) by mouth daily.   atorvastatin 80 MG tablet Commonly known as:  LIPITOR Take 1 tablet (80 mg total) by mouth daily at 6 PM.   benztropine 1 MG tablet Commonly known as:  COGENTIN Take 1 mg by mouth 2 (two) times daily as needed (muscle tension/shaking/jitters).   clopidogrel 75 MG tablet Commonly known as:  PLAVIX Take 1 tablet (75 mg total) by mouth daily with breakfast.   metoprolol tartrate 25 MG tablet Commonly known as:  LOPRESSOR Take 0.5 tablets (12.5 mg total) by mouth 2 (two) times daily.   nitroGLYCERIN 0.4 MG SL tablet Commonly known as:  NITROSTAT Place 1 tablet (0.4 mg total) under the tongue every 5 (five) minutes as needed.   pantoprazole 40 MG tablet Commonly known as:  PROTONIX Take 40 mg by mouth daily before supper.        Allergies:  No Known Allergies    Outstanding Labs/Studies   Needs f/u of CXR abnormality by PCP (personally reviewed w pt)  Duration of Discharge Encounter   Greater than 30 minutes including physician time.  Signed, Nedra Hai Dunn PA-C 01/26/2018, 11:47 AM  I have examined the patient and reviewed assessment and plan and discussed with patient.  Agree with above as stated.    Continue medical therapy.  No further w/u needed.  Discharge later today.  Out of work until Monday.  Negative LFTs, amylase and lipase.  He feels better and wants to go home.   Chronic abnormality on chest xray, to be followed as outpatient.   Larae Grooms

## 2018-01-26 NOTE — Progress Notes (Signed)
Progress Note  Patient Name: Antonio Nguyen Date of Encounter: 01/26/2018  Primary Cardiologist: Nona Dell, MD   Subjective   No chest pain.  Inpatient Medications    Scheduled Meds: . ARIPiprazole  10 mg Oral QHS  . aspirin  81 mg Oral Daily  . atorvastatin  80 mg Oral q1800  . clopidogrel  75 mg Oral Q breakfast  . metoprolol tartrate  12.5 mg Oral BID  . pantoprazole  40 mg Oral QAC supper   Continuous Infusions:  PRN Meds: benztropine, nitroGLYCERIN   Vital Signs    Vitals:   01/25/18 2207 01/26/18 0422 01/26/18 0802 01/26/18 0844  BP: 106/68 108/72  118/83  Pulse:  68 75 72  Resp: 16 17    Temp: 97.9 F (36.6 C) 97.6 F (36.4 C) 98.3 F (36.8 C)   TempSrc: Oral [place]   SpO2: 99% 97% 97%   Weight:  90.5 kg    Height:        Intake/Output Summary (Last 24 hours) at 01/26/2018 1016 Last data filed at 01/26/2018 0900 Gross per 24 hour  Intake 360 ml  Output -  Net 360 ml   Filed Weights   01/25/18 0932 01/25/18 1830 01/26/18 0422  Weight: 91.6 kg 91 kg 90.5 kg    Telemetry    NSR - Personally Reviewed  ECG      Physical Exam   GEN: No acute distress.   Neck: No JVD Cardiac: RRR, no murmurs, rubs, or gallops.  Respiratory: Clear to auscultation bilaterally. GI: Soft, nontender, non-distended  MS: No edema; No deformity. Neuro:  Nonfocal  Psych: Normal affect   Labs    Chemistry Recent Labs  Lab 01/25/18 0928 01/25/18 1845 01/26/18 0736  NA 139  --  139  K 3.9  --  3.7  CL 105  --  108  CO2 25  --  23  GLUCOSE 101*  --  94  BUN 8  --  8  CREATININE 0.80  --  0.85  CALCIUM 9.1  --  8.7*  PROT  --  6.4*  --   ALBUMIN  --  3.7  --   AST  --  20  --   ALT  --  25  --   ALKPHOS  --  89  --   BILITOT  --  0.6  --   GFRNONAA >60  --  >60  GFRAA >60  --  >60  ANIONGAP 9  --  8     Hematology Recent Labs  Lab 01/25/18 0928  WBC 7.2  RBC 4.67  HGB 13.3  HCT 41.2  MCV 88.2  MCH 28.5  MCHC 32.3  RDW 13.3  PLT  298    Cardiac Enzymes Recent Labs  Lab 01/25/18 1845 01/26/18 0112 01/26/18 0736  TROPONINI <0.03 <0.03 <0.03    Recent Labs  Lab 01/25/18 0957 01/25/18 1427  TROPIPOC 0.02 0.04     BNP Recent Labs  Lab 01/25/18 0931  BNP 141.7*     DDimer No results for input(s): DDIMER in the last 168 hours.   Radiology    Dg Chest 2 View  Result Date: 01/25/2018 CLINICAL DATA:  Sudden onset of chest pain without other symptoms. History of myocardial infarction 3 weeks ago. EXAM: CHEST - 2 VIEW COMPARISON:  Portable chest x-ray of January 25, 2018 at DYN 30 3 a.m. FINDINGS: The lungs are well-expanded and clear. There is stable soft tissue density that projects in  the region of the right first costo sternal junction. The heart and mediastinal structures are normal. The trachea is midline. There is no pleural effusion. The bony thorax exhibits no acute abnormality. IMPRESSION: There is no CHF nor other acute cardiopulmonary abnormality. Stable density projecting over the junction of the right first rib anteriorly with the sternum. An apical lordotic view would be useful when the patient can undergo the procedure in an effort to judge whether this reflects a bony finding or if there is a true nodule in the right upper lung. Electronically Signed   By: David  SwazilandJordan M.D.   On: 01/25/2018 12:19   Dg Chest Portable 1 View  Result Date: 01/25/2018 CLINICAL DATA:  47 year old presenting with acute onset of chest pain. Recent MI. EXAM: PORTABLE CHEST 1 VIEW COMPARISON:  None. FINDINGS: Cardiac silhouette mildly enlarged allowing for AP portable technique. Apparent nodular opacity in the RIGHT UPPER LOBE may reflect calcification in the ANTERIOR first costal cartilage. Streaky opacities at the LEFT lung base. Mild linear atelectasis at the RIGHT lung base. Lungs otherwise clear. Pulmonary vascularity normal. IMPRESSION: 1. LEFT basilar atelectasis and/or bronchopneumonia. Mild RIGHT basilar  atelectasis. 2. Mild cardiomegaly without evidence of pulmonary edema. 3. Apparent nodular opacity in the RIGHT upper lobe may reflect calcification in the ANTERIOR first costal cartilage. PA and LATERAL chest x-ray and an apical lordotic chest x-ray is recommended when the patient is clinically stable. Electronically Signed   By: Hulan Saashomas  Lawrence M.D.   On: 01/25/2018 10:07    Cardiac Studies   Negative enzymes  Patient Profile     47 y.o. male with prior CAD.    Assessment & Plan    1) Continue medical therapy.  No further w/u needed.  Discharge later today.  Out of work until Monday.  Negative LFTs, amylase and lipase.  For questions or updates, please contact [facility] Please consult [email or web address] for contact info under        Signed, Lance MussJayadeep Sarahmarie Leavey, MD  01/26/2018, 10:16 AM

## 2018-01-30 NOTE — Addendum Note (Signed)
Addended by: Omaree Fuqua A on: 01/30/2018 04:45 PM   Modules accepted: Orders  

## 2018-03-09 ENCOUNTER — Encounter: Payer: Self-pay | Admitting: Cardiology

## 2018-03-09 NOTE — Progress Notes (Signed)
Cardiology Office Note  Date: 03/10/2018   ID: Armstead Peaks, DOB 1971/04/08, MRN [id number]  PCP: Patient, No Pcp Per  Evaluating Cardiologist: Nona Dell, MD    Chief Complaint  Patient presents with  . Coronary Artery Disease    History of Present Illness: Antonio Nguyen is a 47 y.o. male that I am meeting for the first time today.  I reviewed extensive records and updated the chart.  He has a history of inferior STEMI in July of this year status post cardiac catheterization at which point he underwent aspiration thrombectomy and angioplasty of the PDA.  It was felt that his infarct was due to distal embolization of thrombus into the PDA from nonobstructive ulcerative plaque in the proximal RCA.  Otherwise he had no significant left system disease and was managed medically.  He has followed in the office with Mr. Lenn Cal, and was actually readmitted to the hospital with atypical chest pain in September.  He presents today for follow-up, states that he has had no significant chest pain.  He reports compliance with his medications.  He is currently out of work but eager to return.  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  He states that he has been compliant with Lipitor, we will obtain follow-up FLP and LFTs.  Otherwise plan to continue dual antiplatelet therapy for now.  He reports no bleeding problems.   Past Medical History:  Diagnosis Date  . Bipolar disorder (HCC)   . Essential hypertension   . Hyperlipidemia   . ST elevation myocardial infarction (STEMI) of inferior wall (HCC)    11/22/17 PTCA and thrombectomy to the PDA, PLOM branch. EF 45-50%    Past Surgical History:  Procedure Laterality Date  . CORONARY BALLOON ANGIOPLASTY N/A 11/21/2017   Procedure: CORONARY BALLOON ANGIOPLASTY;  Surgeon: Swaziland, Peter M, MD;  Location: [facility] INVASIVE CV LAB;  Service: Cardiovascular;  Laterality: N/A;  . CORONARY THROMBECTOMY N/A 11/21/2017   Procedure: Coronary Thrombectomy;  Surgeon:  Swaziland, Peter M, MD;  Location: [facility] INVASIVE CV LAB;  Service: Cardiovascular;  Laterality: N/A;  . LEFT HEART CATH AND CORONARY ANGIOGRAPHY N/A 11/21/2017   Procedure: LEFT HEART CATH AND CORONARY ANGIOGRAPHY;  Surgeon: Swaziland, Peter M, MD;  Location: [facility] INVASIVE CV LAB;  Service: Cardiovascular;  Laterality: N/A;    Current Outpatient Medications  Medication Sig Dispense Refill  . ARIPiprazole (ABILIFY) 10 MG tablet Take 10 mg by mouth at bedtime.    Marland Kitchen aspirin 81 MG chewable tablet Chew 1 tablet (81 mg total) by mouth daily.    Marland Kitchen atorvastatin (LIPITOR) 80 MG tablet Take 1 tablet (80 mg total) by mouth daily at 6 PM. 90 tablet 1  . benztropine (COGENTIN) 1 MG tablet Take 1 mg by mouth 2 (two) times daily as needed (muscle tension/shaking/jitters).    . clopidogrel (PLAVIX) 75 MG tablet Take 1 tablet (75 mg total) by mouth daily with breakfast. 90 tablet 2  . metoprolol tartrate (LOPRESSOR) 25 MG tablet Take 0.5 tablets (12.5 mg total) by mouth 2 (two) times daily. 60 tablet 1  . nitroGLYCERIN (NITROSTAT) 0.4 MG SL tablet Place 1 tablet (0.4 mg total) under the tongue every 5 (five) minutes as needed. 25 tablet 2  . pantoprazole (PROTONIX) 40 MG tablet Take 40 mg by mouth daily before supper.     No current facility-administered medications for this visit.    Facility-Administered Medications Ordered in Other Visits  Medication Dose Route Frequency Provider Last Rate Last  Dose  . 0.9 %  sodium chloride infusion    Continuous PRN Swazil[place], Peter M, MD 100 mL/hr at 11/21/17 1610 100 mL/hr at 11/21/17 1610  . clopidogrel (PLAVIX) tablet    PRN [place], Peter M, MD   600 mg at 11/21/17 1606  . heparin infusion 2 units/mL in 0.9 % sodium chloride    Continuous PRN [place], Peter M, MD   500 mL at 11/21/17 1537  . heparin injection    PRN [place], Peter M, MD   3,000 Units at 11/21/17 1551  . iopamidol (ISOVUE-370) 76 % injection    PRN [place], Peter M, MD   110 mL at 11/21/17 1613  . lidocaine (PF)  (XYLOCAINE) 1 % injection    PRN [place], Peter M, MD   2 mL at 11/21/17 1533  . norepinephrine (LEVOPHED) 4 mg in dextrose 5 % 250 mL (0.016 mg/mL) infusion    Continuous PRN [place], Peter M, MD   Stopped at 11/21/17 1610  . Radial Cocktail/Verapamil only    PRN [place], Peter M, MD   10 mL at 11/21/17 1534  . tirofiban (AGGRASTAT) bolus via infusion    PRN [place], Peter M, MD   [phone] mcg at 11/21/17 1547  . tirofiban (AGGRASTAT) infusion 50 mcg/mL 250 mL    Continuous PRN [place], Peter M, MD 16.3 mL/hr at 11/21/17 1552 0.15 mcg/kg/min at 11/21/17 1552   Allergies:  Patient has no known allergies.   Social History: The patient  reports that he has quit smoking. His smoking use included cigarettes. He has a 20.00 pack-year smoking history. He has never used smokeless tobacco. He reports that he does not drink alcohol or use drugs.   ROS:  Please see the history of present illness. Otherwise, complete review of systems is positive for none.  All other systems are reviewed and negative.   Physical Exam: VS:  BP 128/84   Pulse 99   Ht 6\' 3"  (1.905 m)   Wt 200 lb (90.7 kg)   SpO2 98%   BMI 25.00 kg/m , BMI Body mass index is 25 kg/m.  Wt Readings from Last 3 Encounters:  03/10/18 200 lb (90.7 kg)  01/26/18 199 lb 8 oz (90.5 kg)  01/24/18 202 lb (91.6 kg)    General: Patient appears comfortable at rest. HEENT: Conjunctiva and lids normal, oropharynx clear. Neck: Supple, no elevated JVP or carotid bruits, no thyromegaly. Lungs: Clear to auscultation, nonlabored breathing at rest. Cardiac: Regular rate and rhythm, no S3 or significant systolic murmur. Abdomen: Soft, nontender, bowel sounds present. Extremities: No pitting edema, distal pulses 2+. Skin: Warm and dry. Musculoskeletal: No kyphosis. Neuropsychiatric: Alert and oriented x3, affect grossly appropriate.  ECG: I personally reviewed the tracing from 01/25/2018 which showed sinus rhythm with old inferior infarct pattern and  nonspecific ST-T wave abnormalities.  Recent Labwork: 01/25/2018: ALT 25; AST 20; B Natriuretic Peptide 141.7; Hemoglobin 13.3; Platelets 298; TSH 1.627 01/26/2018: BUN 8; Creatinine, Ser 0.85; Potassium 3.7; Sodium 139     Component Value Date/Time   CHOL 165 11/21/2017 1545   TRIG 66 11/21/2017 1545   HDL 40 (L) 11/21/2017 1545   CHOLHDL 4.1 11/21/2017 1545   VLDL 13 11/21/2017 1545   LDLCALC 112 (H) 11/21/2017 1545    Other Studies Reviewed Today:  Echocardiogram 11/22/2017: Study Conclusions  - Left ventricle: The cavity size was normal. Wall thickness was   normal. Systolic function was mildly reduced. The estimated   ejection fraction was in the  range of 45% to 50%. Hypokinesis of   the inferior and inferoseptal myocardium. - Right ventricle: The cavity size was mildly dilated. Wall   thickness was normal. Systolic function was moderately to   severely reduced.  Impressions:  - Focal wall motion abnormalities in inferior and inferoseptal   walls with overall mild decrease in LV ejection fraction. RV   systolic function severely reduced, best seen in subcostal   windows.  Cardiac catheterization and PCI 11/21/2017:  Prox RCA lesion is 25% stenosed.  1st RPLB lesion is 100% stenosed.  RPDA lesion is 100% stenosed.  Balloon angioplasty was performed using a BALLOON EMERGE MR 2.0X12.  Post intervention, there is a 0% residual stenosis.  LV end diastolic pressure is normal.   1. Acute inferior STEMI secondary to distal embolization of thrombus into the PDA and PLOM branches from a nonobstructive ulcerative plaque in the proximal RCA 2. Normal LVEDP 3. LV gram not performed due to high creatinine 4. Successful reperfusion of the PDA with aspiration thrombectomy and PTCA  Assessment and Plan:  1.  History of inferior STEMI in July of this year secondary to embolic plaque into the PDA and PLOM from ruptured plaque in the proximal RCA.  He underwent aspiration  thrombectomy and PTCA and has done well.  No obstructive left system disease.  Continue aspirin and Plavix for now.  LVEF 45 to 50%.  2.  Mixed hyperlipidemia, he reports compliance with high-dose Lipitor.  Follow-up FLP and LFTs.  3.  Essential hypertension, blood pressure is well controlled today.  4.  Bipolar disorder, follows with behavioral health specialist in [place].  Current medicines were reviewed with the patient today.   Orders Placed This Encounter  Procedures  . Flu Vaccine QUAD 36+ mos IM  . Lipid Profile  . Hepatic function panel    Disposition: Follow-up in 6 months.  Signed, Jonelle Sidle, MD, [facility] 03/10/2018 2:29 PM    Antonio Nguyen at [facility] [address] [place], [place] [address] Phone: [phone]; Fax: [phone]

## 2018-03-10 ENCOUNTER — Ambulatory Visit (INDEPENDENT_AMBULATORY_CARE_PROVIDER_SITE_OTHER): Payer: BLUE CROSS/BLUE SHIELD | Admitting: Cardiology

## 2018-03-10 ENCOUNTER — Encounter: Payer: Self-pay | Admitting: Cardiology

## 2018-03-10 VITALS — BP 128/84 | HR 99 | Ht 75.0 in | Wt 200.0 lb

## 2018-03-10 DIAGNOSIS — Z23 Encounter for immunization: Secondary | ICD-10-CM | POA: Diagnosis not present

## 2018-03-10 DIAGNOSIS — E782 Mixed hyperlipidemia: Secondary | ICD-10-CM | POA: Diagnosis not present

## 2018-03-10 DIAGNOSIS — I25119 Atherosclerotic heart disease of native coronary artery with unspecified angina pectoris: Secondary | ICD-10-CM

## 2018-03-10 DIAGNOSIS — I10 Essential (primary) hypertension: Secondary | ICD-10-CM

## 2018-03-10 NOTE — Patient Instructions (Signed)
Your physician wants you to follow-up in:  6 months with Dr.McDowell You will receive a reminder letter in the mail two months in advance. If you don't receive a letter, please call our office to schedule the follow-up appointment.   Your physician recommends that you continue on your current medications as directed. Please refer to the Current Medication list given to you today.    If you need a refill on your cardiac medications before your next appointment, please call your pharmacy.    Get fasting lipids and lft's     No tests today     Thank you for choosing [facility] !

## 2018-04-25 ENCOUNTER — Observation Stay (HOSPITAL_COMMUNITY)
Admission: EM | Admit: 2018-04-25 | Discharge: 2018-04-26 | Disposition: A | Discharge status: Home / Self Care | Payer: Medicaid - Out of State | Attending: Internal Medicine | Admitting: Internal Medicine

## 2018-04-25 ENCOUNTER — Ambulatory Visit (HOSPITAL_COMMUNITY): Payer: Medicaid - Out of State

## 2018-04-25 ENCOUNTER — Encounter (HOSPITAL_COMMUNITY): Payer: Self-pay | Admitting: Emergency Medicine

## 2018-04-25 ENCOUNTER — Other Ambulatory Visit: Payer: Self-pay

## 2018-04-25 ENCOUNTER — Emergency Department (HOSPITAL_COMMUNITY): Payer: Medicaid - Out of State

## 2018-04-25 DIAGNOSIS — R079 Chest pain, unspecified: Secondary | ICD-10-CM | POA: Diagnosis present

## 2018-04-25 DIAGNOSIS — I255 Ischemic cardiomyopathy: Secondary | ICD-10-CM | POA: Diagnosis present

## 2018-04-25 DIAGNOSIS — I2510 Atherosclerotic heart disease of native coronary artery without angina pectoris: Secondary | ICD-10-CM | POA: Diagnosis present

## 2018-04-25 DIAGNOSIS — I5022 Chronic systolic (congestive) heart failure: Secondary | ICD-10-CM | POA: Insufficient documentation

## 2018-04-25 DIAGNOSIS — I252 Old myocardial infarction: Secondary | ICD-10-CM | POA: Insufficient documentation

## 2018-04-25 DIAGNOSIS — Z87891 Personal history of nicotine dependence: Secondary | ICD-10-CM | POA: Diagnosis not present

## 2018-04-25 DIAGNOSIS — Z7902 Long term (current) use of antithrombotics/antiplatelets: Secondary | ICD-10-CM | POA: Diagnosis not present

## 2018-04-25 DIAGNOSIS — F319 Bipolar disorder, unspecified: Secondary | ICD-10-CM | POA: Insufficient documentation

## 2018-04-25 DIAGNOSIS — R0789 Other chest pain: Principal | ICD-10-CM | POA: Insufficient documentation

## 2018-04-25 DIAGNOSIS — I110 Hypertensive heart disease with heart failure: Secondary | ICD-10-CM | POA: Diagnosis not present

## 2018-04-25 DIAGNOSIS — Z7982 Long term (current) use of aspirin: Secondary | ICD-10-CM | POA: Diagnosis not present

## 2018-04-25 DIAGNOSIS — Z8659 Personal history of other mental and behavioral disorders: Secondary | ICD-10-CM

## 2018-04-25 DIAGNOSIS — E785 Hyperlipidemia, unspecified: Secondary | ICD-10-CM | POA: Diagnosis present

## 2018-04-25 DIAGNOSIS — Z8249 Family history of ischemic heart disease and other diseases of the circulatory system: Secondary | ICD-10-CM | POA: Diagnosis not present

## 2018-04-25 DIAGNOSIS — R072 Precordial pain: Secondary | ICD-10-CM | POA: Diagnosis not present

## 2018-04-25 DIAGNOSIS — N179 Acute kidney failure, unspecified: Secondary | ICD-10-CM | POA: Diagnosis present

## 2018-04-25 DIAGNOSIS — E860 Dehydration: Secondary | ICD-10-CM | POA: Diagnosis not present

## 2018-04-25 LAB — URINALYSIS, ROUTINE W REFLEX MICROSCOPIC
Bilirubin Urine: NEGATIVE
Glucose, UA: NEGATIVE mg/dL
Ketones, ur: 5 mg/dL — AB
Leukocytes, UA: NEGATIVE
Nitrite: NEGATIVE
Protein, ur: NEGATIVE mg/dL
SPECIFIC GRAVITY, URINE: 1.013 (ref 1.005–1.030)
pH: 5 (ref 5.0–8.0)

## 2018-04-25 LAB — RAPID URINE DRUG SCREEN, HOSP PERFORMED
Amphetamines: NOT DETECTED
Barbiturates: NOT DETECTED
Benzodiazepines: NOT DETECTED
Cocaine: NOT DETECTED
OPIATES: NOT DETECTED
TETRAHYDROCANNABINOL: NOT DETECTED

## 2018-04-25 LAB — BASIC METABOLIC PANEL
Anion gap: 12 (ref 5–15)
Anion gap: 15 (ref 5–15)
BUN: 28 mg/dL — AB (ref 6–20)
BUN: 26 mg/dL — ABNORMAL HIGH (ref 6–20)
CHLORIDE: 98 mmol/L (ref 98–111)
CHLORIDE: 100 mmol/L (ref 98–111)
CO2: 24 mmol/L (ref 22–32)
CO2: 21 mmol/L — ABNORMAL LOW (ref 22–32)
CREATININE: 1.76 mg/dL — AB (ref 0.61–1.24)
Calcium: 8.9 mg/dL (ref 8.9–10.3)
Calcium: 9 mg/dL (ref 8.9–10.3)
Creatinine, Ser: 2.58 mg/dL — ABNORMAL HIGH (ref 0.61–1.24)
GFR calc Af Amer: 33 mL/min — ABNORMAL LOW (ref >60)
GFR calc Af Amer: 52 mL/min — ABNORMAL LOW (ref >60)
GFR calc non Af Amer: 28 mL/min — ABNORMAL LOW (ref >60)
GFR calc non Af Amer: 45 mL/min — ABNORMAL LOW (ref >60)
GLUCOSE: 92 mg/dL (ref 70–99)
Glucose, Bld: 83 mg/dL (ref 70–99)
Potassium: 4.1 mmol/L (ref 3.5–5.1)
Potassium: 3.8 mmol/L (ref 3.5–5.1)
SODIUM: 134 mmol/L — AB (ref 135–145)
SODIUM: 136 mmol/L (ref 135–145)

## 2018-04-25 LAB — CK TOTAL AND CKMB (NOT AT ARMC)
CK, MB: 11.2 ng/mL — ABNORMAL HIGH (ref 0.5–5.0)
Relative Index: 2.6 — ABNORMAL HIGH (ref 0.0–2.5)
Total CK: 437 U/L — ABNORMAL HIGH (ref 49–397)

## 2018-04-25 LAB — CREATININE, URINE, RANDOM: Creatinine, Urine: 128.48 mg/dL

## 2018-04-25 LAB — I-STAT TROPONIN, ED
Troponin i, poc: 0.03 ng/mL (ref 0.00–0.08)
Troponin i, poc: 0.02 ng/mL (ref 0.00–0.08)

## 2018-04-25 LAB — BRAIN NATRIURETIC PEPTIDE: B Natriuretic Peptide: 49.5 pg/mL (ref 0.0–100.0)

## 2018-04-25 LAB — D-DIMER, QUANTITATIVE: D-Dimer, Quant: 0.34 ug/mL-FEU (ref 0.00–0.50)

## 2018-04-25 LAB — CBC
HEMATOCRIT: 42.2 % (ref 39.0–52.0)
Hemoglobin: 13.3 g/dL (ref 13.0–17.0)
MCH: 27.4 pg (ref 26.0–34.0)
MCHC: 31.5 g/dL (ref 30.0–36.0)
MCV: 87 fL (ref 80.0–100.0)
Platelets: 339 10*3/uL (ref 150–400)
RBC: 4.85 MIL/uL (ref 4.22–5.81)
RDW: 13.3 % (ref 11.5–15.5)
WBC: 9.3 10*3/uL (ref 4.0–10.5)
nRBC: 0 % (ref 0.0–0.2)

## 2018-04-25 LAB — TROPONIN I: Troponin I: <0.03 ng/mL (ref <0.03)

## 2018-04-25 LAB — OSMOLALITY, URINE: Osmolality, Ur: 453 mOsm/kg (ref 300–900)

## 2018-04-25 LAB — SODIUM, URINE, RANDOM: Sodium, Ur: 69 mmol/L

## 2018-04-25 LAB — OSMOLALITY: Osmolality: 288 mOsm/kg (ref 275–295)

## 2018-04-25 MED ORDER — PANTOPRAZOLE SODIUM 40 MG PO TBEC
40.0000 mg | DELAYED_RELEASE_TABLET | Freq: Every day | ORAL | Status: DC
  Administered 2018-04-25: 40 mg via ORAL
  Filled 2018-04-25: qty 1

## 2018-04-25 MED ORDER — SODIUM CHLORIDE 0.9 % IV SOLN: Freq: Once | INTRAVENOUS | Status: DC

## 2018-04-25 MED ORDER — HEPARIN SODIUM (PORCINE) 5000 UNIT/ML IJ SOLN
5000.0000 [IU] | Freq: Three times a day (TID) | INTRAMUSCULAR | Status: DC
  Administered 2018-04-25 – 2018-04-26 (×2): 5000 [IU] via SUBCUTANEOUS
  Filled 2018-04-25 (×2): qty 1

## 2018-04-25 MED ORDER — ONDANSETRON HCL 4 MG/2ML IJ SOLN: 4.0000 mg | Freq: Four times a day (QID) | INTRAMUSCULAR | Status: DC | PRN

## 2018-04-25 MED ORDER — ACETAMINOPHEN 325 MG PO TABS: 650.0000 mg | ORAL_TABLET | ORAL | Status: DC | PRN

## 2018-04-25 MED ORDER — ATORVASTATIN CALCIUM 80 MG PO TABS
80.0000 mg | ORAL_TABLET | Freq: Every day | ORAL | Status: DC
  Administered 2018-04-25: 80 mg via ORAL
  Filled 2018-04-25: qty 1

## 2018-04-25 MED ORDER — CLOPIDOGREL BISULFATE 75 MG PO TABS
75.0000 mg | ORAL_TABLET | Freq: Every day | ORAL | Status: DC
  Administered 2018-04-26: 75 mg via ORAL
  Filled 2018-04-25: qty 1

## 2018-04-25 MED ORDER — NITROGLYCERIN 0.4 MG SL SUBL: 0.4000 mg | SUBLINGUAL_TABLET | SUBLINGUAL | Status: DC | PRN

## 2018-04-25 MED ORDER — ASPIRIN 81 MG PO CHEW
81.0000 mg | CHEWABLE_TABLET | Freq: Every day | ORAL | Status: DC
  Administered 2018-04-26: 81 mg via ORAL
  Filled 2018-04-25: qty 1

## 2018-04-25 MED ORDER — METOPROLOL TARTRATE 12.5 MG HALF TABLET
12.5000 mg | ORAL_TABLET | Freq: Two times a day (BID) | ORAL | Status: DC
  Administered 2018-04-25: 12.5 mg via ORAL
  Filled 2018-04-25 (×2): qty 1

## 2018-04-25 MED ORDER — ARIPIPRAZOLE 10 MG PO TABS
10.0000 mg | ORAL_TABLET | Freq: Every day | ORAL | Status: DC
  Administered 2018-04-25: 10 mg via ORAL
  Filled 2018-04-25 (×2): qty 1

## 2018-04-25 MED ORDER — MORPHINE SULFATE (PF) 2 MG/ML IV SOLN: 2.0000 mg | INTRAVENOUS | Status: DC | PRN

## 2018-04-25 MED ORDER — SODIUM CHLORIDE 0.9 % IV SOLN
INTRAVENOUS | Status: DC
  Administered 2018-04-25 – 2018-04-26 (×2): via INTRAVENOUS

## 2018-04-25 MED ORDER — BENZTROPINE MESYLATE 1 MG PO TABS
1.0000 mg | ORAL_TABLET | Freq: Two times a day (BID) | ORAL | Status: DC | PRN
  Filled 2018-04-25: qty 1

## 2018-04-25 MED ORDER — SODIUM CHLORIDE 0.9 % IV BOLUS
500.0000 mL | Freq: Once | INTRAVENOUS | Status: AC
  Administered 2018-04-25: 500 mL via INTRAVENOUS

## 2018-04-25 MED ORDER — ALUM & MAG HYDROXIDE-SIMETH 200-200-20 MG/5ML PO SUSP: 15.0000 mL | ORAL | Status: DC | PRN

## 2018-04-25 NOTE — Consult Note (Addendum)
Cardiology Consultation:   Patient ID: Antonio Nguyen; [id number]; Jan 17, 1971   Admit date: 04/25/2018 Date of Consult: 04/25/2018  Primary Care Provider: Patient, No Pcp Per Primary Cardiologist: Nona DellSamuel McDowell, MD Primary Electrophysiologist:  none   Patient Profile:   Antonio PeaksBrian Sather is a 47 y.o. male with a PMH of CAD s/p inferior STEMI 11/2017 with aspiration thrombectomy and angioplasty of the PDA, mild ischemic cardiomyopathy (EF 45-50% on echo 11/2017), HTN, HLD, and bipolar disorder who is being seen today for the evaluation of chest pain at the request of Dr. Erma HeritageIsaacs.  History of Present Illness:   Mr. Antonio Nguyen presented with sudden onset left sided chest pain with radiation of pain to his left arm and shoulder blade with associated SOB and lightheadedness. He reports pain is similar to chest pain he experienced in July in the setting of an inferior STEMI. He noted some clamminess but no diaphoresis, nausea, vomiting, or syncope. Pain was non-exertional in nature. EMS was activated and he was given SL nitro x1 with no improvement in symptoms, then fentanyl with resolution of pain.   Last seen outpatient by cardiology, Dr. Diona BrownerMcDowell, 03/10/18 at which time he had no complaints of chest pain. He underwent a LHC 11/2017 in the setting of inferior STEMI and was found to have a non-obstructive ulcerated plaque in the proximal RCA with distal embolization of thrombus into the PDA and PLOM branches with successful reperfusion of the PDA with aspiration thrombectomy and PTCA. He has been on DAPT with aspirin and plavix since that time. Echo showed EF 45-50% with focal wall motion abnormality in inferior/inferoseptal walls. Denies tobacco, etoh, or recreational drug use.   At the time of this evaluation he is chest pain free. No typical anginal complaints in recent months. No syncope, orthopnea, PND, LE edema, or weight gain. He reported some difficulty urinating over the past couple days - hesitancy and  pain with stopping stream. No blood noted. Has been on BPH medications in the past but stopped 2 years ago. Denies abdominal pain, fever, diarrhea, or constipation.   ED course: VSS. Labs notable for electrolytes wnl, Cr 2.58 (baseline 0.8), CBC wnl, BNP 49.5, Trop negative x1, Ddimer negative. UA with small blood, ketones, and hyaline casts. CXR without acute findings. EKG with sinus rhythm with Q waves in inferior leads, no STE/D, no TWI. He received aspirin and fentanyl en route to the ED with improvement in pain. Given NS for AKI. Cardiology asked to evaluate for chest pain.   Past Medical History:  Diagnosis Date  . Bipolar disorder (HCC)   . Essential hypertension   . Hyperlipidemia   . ST elevation myocardial infarction (STEMI) of inferior wall (HCC)    11/22/17 PTCA and thrombectomy to the PDA, PLOM branch. EF 45-50%    Past Surgical History:  Procedure Laterality Date  . CORONARY BALLOON ANGIOPLASTY N/A 11/21/2017   Procedure: CORONARY BALLOON ANGIOPLASTY;  Surgeon: SwazilandJordan, Peter M, MD;  [facility];  Service: Cardiovascular;  Laterality: N/A;  . CORONARY THROMBECTOMY N/A 11/21/2017   Procedure: Coronary Thrombectomy;  Surgeon: SwazilandJordan, Peter M, MD;  [facility];  Service: Cardiovascular;  Laterality: N/A;  . LEFT HEART CATH AND CORONARY ANGIOGRAPHY N/A 11/21/2017   Procedure: LEFT HEART CATH AND CORONARY ANGIOGRAPHY;  Surgeon: SwazilandJordan, Peter M, MD;  [facility];  Service: Cardiovascular;  Laterality: N/A;     Home Medications:  Prior to Admission medications   Medication Sig Start Date End  Date Taking? Authorizing Provider  ARIPiprazole (ABILIFY) 10 MG tablet Take 10 mg by mouth at bedtime.    [provider]  aspirin 81 MG chewable tablet Chew 1 tablet (81 mg total) by mouth daily. 11/25/17   Arty Baumgartner, NP  atorvastatin (LIPITOR) 80 MG tablet Take 1 tablet (80 mg total) by mouth daily at 6 PM. 11/24/17   Arty Baumgartner, NP  benztropine (COGENTIN) 1 MG tablet Take 1 mg by mouth 2 (two) times daily as needed (muscle tension/shaking/jitters).    [provider]  clopidogrel (PLAVIX) 75 MG tablet Take 1 tablet (75 mg total) by mouth daily with breakfast. 11/25/17   Laverda Page B, NP  metoprolol tartrate (LOPRESSOR) 25 MG tablet Take 0.5 tablets (12.5 mg total) by mouth 2 (two) times daily. 11/24/17   Arty Baumgartner, NP  nitroGLYCERIN (NITROSTAT) 0.4 MG SL tablet Place 1 tablet (0.4 mg total) under the tongue every 5 (five) minutes as needed. 11/24/17   Arty Baumgartner, NP  pantoprazole (PROTONIX) 40 MG tablet Take 40 mg by mouth daily before supper.    [provider]    Inpatient Medications: Scheduled Meds:  Continuous Infusions: . sodium chloride    . sodium chloride 500 mL (04/25/18 1611)   PRN Meds: nitroGLYCERIN  Allergies:   No Known Allergies  Social History:   Social History   Socioeconomic History  . Marital status: Single    Spouse name: Not on file  . Number of children: Not on file  . Years of education: Not on file  . Highest education level: Not on file  Occupational History  . Not on file  Social Needs  . Financial resource strain: Not hard at all  . Food insecurity:    Worry: Patient refused    Inability: Patient refused  . Transportation needs:    Medical: Patient refused    Non-medical: Patient refused  Tobacco Use  . Smoking status: Former Smoker    Packs/day: 1.00    Years: 20.00    Pack years: 20.00    Types: Cigarettes  . Smokeless tobacco: Never Used  . Tobacco comment: already quit smoking  Substance and Sexual Activity  . Alcohol use: Never    Frequency: Never  . Drug use: Never  . Sexual activity: Not on file  Lifestyle  . Physical activity:    Days per week: 1 day    Minutes per session: 50 min  . Stress: Very much  Relationships  . Social connections:    Talks on phone: Twice a week    Gets together: Twice a week     Attends religious service: Never    Active member of club or organization: No    Attends meetings of clubs or organizations: Never    Relationship status: Divorced  . Intimate partner violence:    Fear of current or ex partner: No    Emotionally abused: No    Physically abused: No    Forced sexual activity: No  Other Topics Concern  . Not on file  Social History Narrative  . Not on file    Family History:    Family History  Problem Relation Age of Onset  . Hypertension Father      ROS:  Please see the history of present illness.   All other ROS reviewed and negative.     Physical Exam/Data:   Vitals:   04/25/18 1330 04/25/18 1345 04/25/18 1430 04/25/18 1611  BP: 102/64 Marland Kitchen)  107/56 114/68 106/68  Pulse: 80 72 84 89  Resp: 14 16 20  (!) 23  Temp:      TempSrc:      SpO2: 96% 96% 97% 100%  Weight:      Height:       No intake or output data in the 24 hours ending 04/25/18 1614 Filed Weights   04/25/18 1211  Weight: 90.7 kg   Body mass index is 25 kg/m.  General:  Well nourished, well developed, laying in bed in no acute distress HEENT: sclera anicteric  Neck: no JVD Vascular: No carotid bruits; distal pulses 2+ bilaterally Cardiac:  normal S1, S2; RRR; no murmurs, rubs, or gallops Lungs:  clear to auscultation bilaterally, no wheezing, rhonchi or rales  Abd: NABS, soft, nontender, no hepatomegaly Ext: no edema Musculoskeletal:  No deformities, BUE and BLE strength normal and equal Skin: warm and dry  Neuro:  CNs 2-12 intact, no focal abnormalities noted Psych:  Normal affect   EKG:  The EKG was personally reviewed and demonstrates:  sinus rhythm with Q waves in inferior leads, no STE/D, no TWI. Telemetry:   Not on telemetry  Relevant CV Studies: Echocardiogram 11/2017 Study Conclusions  - Left ventricle: The cavity size was normal. Wall thickness was   normal. Systolic function was mildly reduced. The estimated   ejection fraction was in the range of 45%  to 50%. Hypokinesis of   the inferior and inferoseptal myocardium. - Right ventricle: The cavity size was mildly dilated. Wall   thickness was normal. Systolic function was moderately to   severely reduced.  Impressions:  - Focal wall motion abnormalities in inferior and inferoseptal   walls with overall mild decrease in LV ejection fraction. RV   systolic function severely reduced, best seen in subcostal   windows.  Left heart catheterization 11/2017:  Prox RCA lesion is 25% stenosed.  1st RPLB lesion is 100% stenosed.  RPDA lesion is 100% stenosed.  Balloon angioplasty was performed using a BALLOON EMERGE MR 2.0X12.  Post intervention, there is a 0% residual stenosis.  LV end diastolic pressure is normal.   1. Acute inferior STEMI secondary to distal embolization of thrombus into the PDA and PLOM branches from a nonobstructive ulcerative plaque in the proximal RCA 2. Normal LVEDP 3. LV gram not performed due to high creatinine 4. Successful reperfusion of the PDA with aspiration thrombectomy and PTCA  Recommend uninterrupted dual antiplatelet therapy with Aspirin 81mg  daily and Clopidogrel 75mg  daily for a minimum of 12 months (ACS - Class I recommendation). Will continue Aggrastat IV for 18 hours. Assess LV function with Echo.   Laboratory Data:  Chemistry Recent Labs  Lab 04/25/18 1254  NA 134*  K 4.1  CL 98  CO2 24  GLUCOSE 92  BUN 28*  CREATININE 2.58*  CALCIUM 8.9  GFRNONAA 28*  GFRAA 33*  ANIONGAP 12    No results for input(s): PROT, ALBUMIN, AST, ALT, ALKPHOS, BILITOT in the last 168 hours. Hematology Recent Labs  Lab 04/25/18 1254  WBC 9.3  RBC 4.85  HGB 13.3  HCT 42.2  MCV 87.0  MCH 27.4  MCHC 31.5  RDW 13.3  PLT 339   Cardiac EnzymesNo results for input(s): TROPONINI in the last 168 hours.  Recent Labs  Lab 04/25/18 1338  TROPIPOC 0.03    BNP Recent Labs  Lab 04/25/18 1254  BNP 49.5    DDimer  Recent Labs  Lab  04/25/18 1319  DDIMER 0.34  Radiology/Studies:  Dg Chest 2 View  Result Date: 04/25/2018 CLINICAL DATA:  Chest pain. EXAM: CHEST - 2 VIEW COMPARISON:  Radiographs of January 25, 2018. FINDINGS: The heart size and mediastinal contours are within normal limits. Both lungs are clear. No pneumothorax or pleural effusion is noted. The visualized skeletal structures are unremarkable. IMPRESSION: No active cardiopulmonary disease. Electronically Signed   By: Lupita Raider, M.D.   On: 04/25/2018 14:46    Assessment and Plan:   1. Chest pain in patient with inferior STEMI 11/2017 with PCI to PDA and residual small occlusion of PLA: patient presented with sudden onset left sided chest pain similar to prior MI. Symptoms are non-exertional. No improvement with SL nitro, though did resolve with fentanyl. Mostly atypical symptoms. Trop negative x2. EKG non-ischemic. Ddimer negative. CXR without acute findings. Utox negative.  - Continue to cycle troponins x3 or to peak - Check AM EKG - Further ischemic work-up pending trop trend - Continue aspirin and plavix  - Continue statin and metoprolol  2. AKI: Cr 2.58 on admission; baseline 0.8. UA with small blood, ketones, and hyaline casts. Renal US ordered. S/p IVF bolus.  - Continue management per primary team  3. HTN: BP stable/on the soft side - Continue metoprolol tartrate  4. HLD: LDL 112 11/2017 - Continue statin      For questions or updates, please contact [facility] Please consult [email or web address] for contact info under Cardiology/STEMI.   Signed, Beatriz Stallion, PA-C  04/25/2018 4:14 PM [phone]  I have personally seen and examined this patient with Judy Pimple, PA-CI agree with the assessment and plan as outlined above. Mr. Aguila has a history of CAD with inferior MI in July 2019 due to likely proximal RCA plaque rupture. The distal RCA thrombotic disease was treated with angioplasty and thrombectomy. He has been on ASA  and Plavix since then. He had onset of chest pain this am. The pain is worse with inspiration. Troponin negative x 1. EKG without ischemic changes. I have personally reviewed the EKG and it shows sinus with inferior Q waves.  Labs show creatinine of 2.58 which is new for him. Renal function had been normal.  My exam:   General: Well developed, well nourished, NAD  HEENT: OP clear, mucus membranes moist  SKIN: warm, dry. No rashes. Neuro: No focal deficits  Musculoskeletal: Muscle strength 5/5 all ext  Psychiatric: Mood and affect normal  Neck: No JVD, no carotid bruits, no thyromegaly, no lymphadenopathy.  Lungs:Clear bilaterally, no wheezes, rhonci, crackles Cardiovascular: Regular rate and rhythm. No murmurs, gallops or rubs. Abdomen:Soft. Bowel sounds present. Non-tender.  Extremities: No lower extremity edema. Pulses are 2 + in the bilateral DP/PT.  Plan: Atypical chest pain: no objective evidence of ischemia. Cycle troponin tonight. Continue ASA and Plavix. Would admit to the IM service for workup of acute renal failure.   Verne Carrow 04/25/2018 5:27 PM

## 2018-04-25 NOTE — ED Notes (Signed)
Pt is unable to urinate at this time due pt voided earlier after pre void scan. Order was just placed.

## 2018-04-25 NOTE — ED Notes (Signed)
Pt transported to US

## 2018-04-25 NOTE — ED Provider Notes (Signed)
Assumed care from Dr. Clarene DukeLittle at 3:23 PM. Briefly, the patient is a 47 y.o. male with PMHx of  has a past medical history of Bipolar disorder (HCC), Essential hypertension, Hyperlipidemia, and ST elevation myocardial infarction (STEMI) of inferior wall (HCC). here with chest pain. H/o STEMI in July. Lab work today shows initial normal trop but significant AKI. Cards consulted. Admit to Cards vs Medicine, pending Cards c/s. CP free now.    Labs Reviewed  BASIC METABOLIC PANEL - Abnormal; Notable for the following components:      Result Value   Sodium 134 (*)    BUN 28 (*)    Creatinine, Ser 2.58 (*)    GFR calc non Af Amer 28 (*)    GFR calc Af Amer 33 (*)    All other components within normal limits  CBC  BRAIN NATRIURETIC PEPTIDE  D-DIMER, QUANTITATIVE (NOT AT [facility])  URINALYSIS, ROUTINE W REFLEX MICROSCOPIC  RAPID URINE DRUG SCREEN, HOSP PERFORMED  I-STAT TROPONIN, ED    Course of Care: -Renal U/S ordered for work up. Gentle fluids ordered. Admit ot medicine with cards c/s.     Shaune PollackIsaacs, Chinara Hertzberg, MD 04/25/18 [phone]

## 2018-04-25 NOTE — ED Provider Notes (Addendum)
[facility] Provider Note   CSN: [id number] Arrival date & time: 04/25/18  1208     History   Chief Complaint Chief Complaint  Patient presents with  . Chest Pain    HPI Armstead PeaksBrian Nguyen is a 47 y.o. male with PMH bipolar disorder, STEMI and heart cath 11/2017 presenting with new onset left sided chest pain with associated arm pain, left shoulder blade pain, and SOB that he states is similar to previous chest pain in July. The pain started about two hours prior to presentation and was not associated with exertion. He states the pain is sharp and unrelenting and was relieved by fentanyl en route with EMS. He tried nitro at home which did not help. He denies nausea, vomiting, numbness, tingling, recent illness, dysruia, hematuria, changes in BM, cough or sick contacts. He states he has been compliant with his medications, quit smoking three years ago. He denies alcohol and recreational drug use.  Further discussion after pt found to have AKI, see ED course.   The history is provided by the patient.    Past Medical History:  Diagnosis Date  . Bipolar disorder (HCC)   . Essential hypertension   . Hyperlipidemia   . ST elevation myocardial infarction (STEMI) of inferior wall (HCC)    11/22/17 PTCA and thrombectomy to the PDA, PLOM branch. EF 45-50%    Patient Active Problem List   Diagnosis Date Noted  . Abnormal CXR 01/26/2018  . Atypical chest pain 01/25/2018  . Chest pain in adult 01/24/2018  . CAD S/P percutaneous coronary angioplasty 11/28/2017  . History of bipolar disorder 11/28/2017  . Ischemic cardiomyopathy 11/28/2017  . Dyslipidemia, goal LDL below 70 11/24/2017  . Acute systolic heart failure (HCC) 11/24/2017  . STEMI involving right coronary artery (HCC) 11/21/2017    Past Surgical History:  Procedure Laterality Date  . CORONARY BALLOON ANGIOPLASTY N/A 11/21/2017   Procedure: CORONARY BALLOON ANGIOPLASTY;  Surgeon: SwazilandJordan, Peter  M, MD;  [facility];  Service: Cardiovascular;  Laterality: N/A;  . CORONARY THROMBECTOMY N/A 11/21/2017   Procedure: Coronary Thrombectomy;  Surgeon: SwazilandJordan, Peter M, MD;  [facility];  Service: Cardiovascular;  Laterality: N/A;  . LEFT HEART CATH AND CORONARY ANGIOGRAPHY N/A 11/21/2017   Procedure: LEFT HEART CATH AND CORONARY ANGIOGRAPHY;  Surgeon: SwazilandJordan, Peter M, MD;  [facility];  Service: Cardiovascular;  Laterality: N/A;        Home Medications    Prior to Admission medications   Medication Sig Start Date End Date Taking? Authorizing Provider  ARIPiprazole (ABILIFY) 10 MG tablet Take 10 mg by mouth at bedtime.    [provider]  aspirin 81 MG chewable tablet Chew 1 tablet (81 mg total) by mouth daily. 11/25/17   Arty Baumgartneroberts, Lindsay B, NP  atorvastatin (LIPITOR) 80 MG tablet Take 1 tablet (80 mg total) by mouth daily at 6 PM. 11/24/17   Arty Baumgartneroberts, Lindsay B, NP  benztropine (COGENTIN) 1 MG tablet Take 1 mg by mouth 2 (two) times daily as needed (muscle tension/shaking/jitters).    [provider]  clopidogrel (PLAVIX) 75 MG tablet Take 1 tablet (75 mg total) by mouth daily with breakfast. 11/25/17   Laverda Pageoberts, Lindsay B, NP  metoprolol tartrate (LOPRESSOR) 25 MG tablet Take 0.5 tablets (12.5 mg total) by mouth 2 (two) times daily. 11/24/17   Arty Baumgartneroberts, Lindsay B, NP  nitroGLYCERIN (NITROSTAT) 0.4 MG SL tablet Place 1 tablet (0.4 mg total)  under the tongue every 5 (five) minutes as needed. 11/24/17   Arty Baumgartner, NP  pantoprazole (PROTONIX) 40 MG tablet Take 40 mg by mouth daily before supper.    [provider]    Family History Family History  Problem Relation Age of Onset  . Hypertension Father     Social History Social History   Tobacco Use  . Smoking status: Former Smoker    Packs/day: 1.00    Years: 20.00    Pack years: 20.00    Types: Cigarettes  . Smokeless tobacco: Never Used  . Tobacco comment:  already quit smoking  Substance Use Topics  . Alcohol use: Never    Frequency: Never  . Drug use: Never     Allergies   Patient has no known allergies.   Review of Systems ROS negative except as noted in HPI and ED Course.   Physical Exam Updated Vital Signs BP 114/68 Comment: Simultaneous filing. User may not have seen previous data.  Pulse 84   Temp 97.7 F (36.5 C) (Oral)   Resp 20   Ht 6\' 3"  (1.905 m)   Wt 90.7 kg   SpO2 97%   BMI 25.00 kg/m   Physical Exam  Constitutional: He is oriented to person, place, and time. He appears well-developed and well-nourished. He does not appear ill. No distress.  HENT:  Head: Normocephalic and atraumatic.  Eyes: EOM are normal.  Cardiovascular: Normal rate and regular rhythm. Exam reveals no gallop.  No murmur heard. Pulmonary/Chest: Effort normal and breath sounds normal. He has no wheezes. He has no rhonchi. He has no rales.  Abdominal: Soft. Normal appearance and bowel sounds are normal. He exhibits no distension. There is generalized tenderness and tenderness in the left upper quadrant. There is no CVA tenderness.  Musculoskeletal:       Right lower leg: He exhibits no edema.       Left lower leg: He exhibits no edema.  Neurological: He is alert and oriented to person, place, and time.  Skin: Skin is warm, dry and intact.  Psychiatric: His speech is normal and behavior is normal. His affect is blunt.     ED Treatments / Results  Labs (all labs ordered are listed, but only abnormal results are displayed) Labs Reviewed  BASIC METABOLIC PANEL - Abnormal; Notable for the following components:      Result Value   Sodium 134 (*)    BUN 28 (*)    Creatinine, Ser 2.58 (*)    GFR calc non Af Amer 28 (*)    GFR calc Af Amer 33 (*)    All other components within normal limits  CBC  BRAIN NATRIURETIC PEPTIDE  D-DIMER, QUANTITATIVE (NOT AT [facility])  URINALYSIS, ROUTINE W REFLEX MICROSCOPIC  RAPID URINE DRUG SCREEN, HOSP  PERFORMED  I-STAT TROPONIN, ED    EKG EKG Interpretation  Date/Time:  Tuesday April 25 2018 12:12:45 EST Ventricular Rate:  90 PR Interval:    QRS Duration: 104 QT Interval:  374 QTC Calculation: 458 R Axis:   77 Text Interpretation:  Sinus rhythm Probable inferior infarct, old Q waves and improvement of previous inferior T wave inversions indicative of old infarct Confirmed by Frederick Peers [phone]) on 04/25/2018 12:44:00 PM   Radiology Dg Chest 2 View  Result Date: 04/25/2018 CLINICAL DATA:  Chest pain. EXAM: CHEST - 2 VIEW COMPARISON:  Radiographs of January 25, 2018. FINDINGS: The heart size and mediastinal contours are within normal limits. Both lungs  are clear. No pneumothorax or pleural effusion is noted. The visualized skeletal structures are unremarkable. IMPRESSION: No active cardiopulmonary disease. Electronically Signed   By: Lupita Raider, M.D.   On: 04/25/2018 14:46    Procedures Procedures (including critical care time)  Medications Ordered in ED Medications  nitroGLYCERIN (NITROSTAT) SL tablet 0.4 mg (has no administration in time range)     Initial Impression / Assessment and Plan / ED Course  I have reviewed the triage vital signs and the nursing notes.  Pertinent labs & imaging results that were available during my care of the patient were reviewed by me and considered in my medical decision making (see chart for details).  Clinical Course as of Apr 25 1534  Tue Apr 25, 2018  1416 Chest x-ray, troponin, EKG unremarkable or concerning for repeat ACS. BMP showed elevated creatinine to 2.58. Stated earlier he had no dysuria, hematuria or changes in urination. On further discussion he states he has had some difficulty urinating during the day and endorses sensation of bladder not being completing empty after voiding. He also has a history of enlarged prostate and kidney stone but cannot remember the details of his previous kidney stone. He states he does  not remember having dysuria then but it was seen on Ct. Will obtain pre- and post-void bladder scan.     [JS]    Clinical Course User Index [JS] Versie Starks, DO    47 yo male with PMH STEMI 11/2017 with cath and balloon angioplasty presenting today with acute chest pain radiating to his upper back and left arm he states is similar to previous STEMI. Pain relieved with nitro and fentanyl. Given asa 325 en route to hospital. Troponin within normal limits. CXR, D-dimer, BNP also unremarkable. In addition, he has an AKI with Cr of 2.58, baseline is around .85. Etiology unclear but had similar AKI with STEMI in July. He does endorse some symptoms of urinary retention but post-void residual was 12cc. As he is high risk for repeat cardiology even, case discussed with cardiology, and they will evaluate. If not admitted for further ACS workupt will discuss with medicine for admission for further work-up of his AKI. Discussed with patient and incoming evening ED team.   Final Clinical Impressions(s) / ED Diagnoses   Final diagnoses:  Acute kidney injury [facility])  Chest pain, unspecified type    ED Discharge Orders    None       Awanda Wilcock A, DO 04/25/18 1534    Jakeel Starliper A, DO 04/25/18 1535    Little, Ambrose Finland, MD 04/26/18 [phone]

## 2018-04-25 NOTE — H&P (Signed)
[facility] History and Physical   Patient: Antonio Nguyen ZOX:[id number]   PCP: Patient, No Pcp Per DOB: [birth year]   DOA: 04/25/2018   DOS: 04/25/2018   DOS: the patient was seen and examined on 04/25/2018  Patient coming from: The patient is coming from home.  Chief Complaint: chest pain  HPI: Antonio Nguyen is a 47 y.o. male with Past medical history of bipolar disorder, CAD S/P aspiration thrombectomy and PTCA of PDA, chronic systolic CHF, HLD. Patient presented with complaints of left-sided chest pain. Patient reports that for last couple of days he has been having difficulty with urination. This morning when he was at work he did not have any complaints but later in the afternoon he started having complaints of dizziness and lightheadedness this was followed by left-sided chest pain associated with left arm pain and left shoulder blade pain associated with shortness of breath. He mentions that his pain was similar to his pain when he had STEMI in July 2019. He reports that he is taking all his medications and has not missed or skipped any medication. Denies using any NSAIDs. No nausea no vomiting no diarrhea reported. Denies any drugs or alcohol abuse.  ED Course: Initial troponins were negative, EKG was also not showing any acute STEMI.  Cardiology was consult for further assistance for chest pain. Patient found to have AKI and was referred for admission to medical service.  At his baseline ambulates without any support And is independent for most of his ADL; manages his medication on his own.  Review of Systems: as mentioned in the history of present illness.  All other systems reviewed and are negative.  Past Medical History:  Diagnosis Date  . Bipolar disorder (HCC)   . Essential hypertension   . Hyperlipidemia   . ST elevation myocardial infarction (STEMI) of inferior wall (HCC)    11/22/17 PTCA and thrombectomy to the PDA, PLOM branch. EF 45-50%   Past Surgical  History:  Procedure Laterality Date  . CORONARY BALLOON ANGIOPLASTY N/A 11/21/2017   Procedure: CORONARY BALLOON ANGIOPLASTY;  Surgeon: Swaziland, Peter M, MD;  Location: [facility] INVASIVE CV LAB;  Service: Cardiovascular;  Laterality: N/A;  . CORONARY THROMBECTOMY N/A 11/21/2017   Procedure: Coronary Thrombectomy;  Surgeon: Swaziland, Peter M, MD;  Location: [facility] INVASIVE CV LAB;  Service: Cardiovascular;  Laterality: N/A;  . LEFT HEART CATH AND CORONARY ANGIOGRAPHY N/A 11/21/2017   Procedure: LEFT HEART CATH AND CORONARY ANGIOGRAPHY;  Surgeon: Swaziland, Peter M, MD;  Location: [facility] INVASIVE CV LAB;  Service: Cardiovascular;  Laterality: N/A;   Social History:  reports that he has quit smoking. His smoking use included cigarettes. He has a 20.00 pack-year smoking history. He has never used smokeless tobacco. He reports that he does not drink alcohol or use drugs.  No Known Allergies  Family History  Problem Relation Age of Onset  . Hypertension Father      Prior to Admission medications   Medication Sig Start Date End Date Taking? Authorizing Provider  ARIPiprazole (ABILIFY) 10 MG tablet Take 10 mg by mouth at bedtime.   Yes [provider]  aspirin 81 MG chewable tablet Chew 1 tablet (81 mg total) by mouth daily. 11/25/17  Yes Arty Baumgartner, NP  atorvastatin (LIPITOR) 80 MG tablet Take 1 tablet (80 mg total) by mouth daily at 6 PM. 11/24/17  Yes Arty Baumgartner, NP  benztropine (COGENTIN) 1 MG tablet Take 1 mg by mouth 2 (two) times daily as needed (muscle tension/shaking/jitters).  Yes [provider]  clopidogrel (PLAVIX) 75 MG tablet Take 1 tablet (75 mg total) by mouth daily with breakfast. 11/25/17  Yes Laverda Pageoberts, Lindsay B, NP  metoprolol tartrate (LOPRESSOR) 25 MG tablet Take 0.5 tablets (12.5 mg total) by mouth 2 (two) times daily. 11/24/17  Yes Arty Baumgartneroberts, Lindsay B, NP  nitroGLYCERIN (NITROSTAT) 0.4 MG SL tablet Place 1 tablet (0.4 mg total) under the tongue every 5 (five) minutes as  needed. 11/24/17  Yes Laverda Pageoberts, Lindsay B, NP  pantoprazole (PROTONIX) 40 MG tablet Take 40 mg by mouth daily before supper.   Yes [provider]    Physical Exam: Vitals:   04/25/18 1330 04/25/18 1345 04/25/18 1430 04/25/18 1611  BP: 102/64 (!) 107/56 114/68 106/68  Pulse: 80 72 84 89  Resp: 14 16 20  (!) 23  Temp:      TempSrc:      SpO2: 96% 96% 97% 100%  Weight:      Height:        General: Alert, Awake and Oriented to Time, Place and Person. Appear in moderate distress, affect appropriate Eyes: PERRL, Conjunctiva normal ENT: Oral Mucosa clear dry. Neck: no JVD, no Abnormal Mass Or lumps Cardiovascular: S1 and S2 Present, no Murmur, Peripheral Pulses Present Respiratory: normal respiratory effort, Bilateral Air entry equal and Decreased, no use of accessory muscle, Clear to Auscultation, o Crackles, no wheezes Abdomen: Bowel Sound present, Soft and no tenderness, no hernia Skin: n redness, ono Rash, no induration Extremities: no Pedal edema, no calf tenderness, bilateral legs has some warmth Neurologic: Grossly no focal neuro deficit. Bilaterally Equal motor strength  Labs on Admission:  CBC: Recent Labs  Lab 04/25/18 1254  WBC 9.3  HGB 13.3  HCT 42.2  MCV 87.0  PLT 339   Basic Metabolic Panel: Recent Labs  Lab 04/25/18 1254  NA 134*  K 4.1  CL 98  CO2 24  GLUCOSE 92  BUN 28*  CREATININE 2.58*  CALCIUM 8.9   GFR: Estimated Creatinine Clearance: 42.3 mL/min (A) (by C-G formula based on SCr of 2.58 mg/dL (H)). Liver Function Tests: No results for input(s): AST, ALT, ALKPHOS, BILITOT, PROT, ALBUMIN in the last 168 hours. No results for input(s): LIPASE, AMYLASE in the last 168 hours. No results for input(s): AMMONIA in the last 168 hours. Coagulation Profile: No results for input(s): INR, PROTIME in the last 168 hours. Cardiac Enzymes: No results for input(s): CKTOTAL, CKMB, CKMBINDEX, TROPONINI in the last 168 hours. BNP (last 3 results) No  results for input(s): PROBNP in the last 8760 hours. HbA1C: No results for input(s): HGBA1C in the last 72 hours. CBG: No results for input(s): GLUCAP in the last 168 hours. Lipid Profile: No results for input(s): CHOL, HDL, LDLCALC, TRIG, CHOLHDL, LDLDIRECT in the last 72 hours. Thyroid Function Tests: No results for input(s): TSH, T4TOTAL, FREET4, T3FREE, THYROIDAB in the last 72 hours. Anemia Panel: No results for input(s): VITAMINB12, FOLATE, FERRITIN, TIBC, IRON, RETICCTPCT in the last 72 hours. Urine analysis:    Component Value Date/Time   COLORURINE YELLOW 04/25/2018 1550   APPEARANCEUR CLEAR 04/25/2018 1550   LABSPEC 1.013 04/25/2018 1550   PHURINE 5.0 04/25/2018 1550   GLUCOSEU NEGATIVE 04/25/2018 1550   HGBUR SMALL (A) 04/25/2018 1550   BILIRUBINUR NEGATIVE 04/25/2018 1550   KETONESUR 5 (A) 04/25/2018 1550   PROTEINUR NEGATIVE 04/25/2018 1550   NITRITE NEGATIVE 04/25/2018 1550   LEUKOCYTESUR NEGATIVE 04/25/2018 1550    Radiological Exams on Admission: Dg Chest 2 View  Result Date: 04/25/2018 CLINICAL DATA:  Chest pain. EXAM: CHEST - 2 VIEW COMPARISON:  Radiographs of January 25, 2018. FINDINGS: The heart size and mediastinal contours are within normal limits. Both lungs are clear. No pneumothorax or pleural effusion is noted. The visualized skeletal structures are unremarkable. IMPRESSION: No active cardiopulmonary disease. Electronically Signed   By: Lupita Raider, M.D.   On: 04/25/2018 14:46   EKG: Independently reviewed. normal sinus rhythm, nonspecific ST and T waves changes.  Assessment/Plan 1. Chest pain Atypical. Follow serial troponin. Cardiology consult.  2.  History of CAD. Chronic systolic CHF. Not volume overloaded. We will give IV fluids actually for dehydration. Monitor for volume. Continue aspirin Plavix.  3.  Bipolar disorder. Continue home regimen.  4.  Acute kidney injury. Etiology not clear. Actually had a similar presentation  with acute kidney injury. UDS is negative. We will check CK. We will check urine studies. Ultrasound renal. Hydrate with IV fluids for now.  Nutrition: cardiac diet DVT Prophylaxis: subcutaneous Heparin  Advance goals of care discussion: full code   Consults: cardiology   Family Communication: no family was present at bedside, at the time of interview.  Disposition: Admitted as observation, telemetry unit. Likely to be discharged home, in 1--2 days.  Author: Lynden Oxford, MD [facility] 04/25/2018  If 7PM-7AM, please contact night-coverage [email or web address]

## 2018-04-25 NOTE — ED Triage Notes (Signed)
Pt started having left sided cp that radiates to shoulder blades about an hour ago. Pt had stents placed in June of this year, similar pain. Pt given 1 nitro with no relief, 325mg  ASA. EMS gave 100 mcg fentanyl. Pain decreased from 6/10 to 3/10. Pt feeling SOB as well. BP 102/66, HR 90 SR, CBG 98, 98% on 2L [place]. Pt does not wear O2

## 2018-04-26 DIAGNOSIS — N179 Acute kidney failure, unspecified: Secondary | ICD-10-CM

## 2018-04-26 DIAGNOSIS — Z8659 Personal history of other mental and behavioral disorders: Secondary | ICD-10-CM

## 2018-04-26 DIAGNOSIS — I2510 Atherosclerotic heart disease of native coronary artery without angina pectoris: Secondary | ICD-10-CM

## 2018-04-26 DIAGNOSIS — R072 Precordial pain: Secondary | ICD-10-CM | POA: Diagnosis not present

## 2018-04-26 DIAGNOSIS — E785 Hyperlipidemia, unspecified: Secondary | ICD-10-CM

## 2018-04-26 LAB — CBC WITH DIFFERENTIAL/PLATELET
Abs Immature Granulocytes: 0.03 10*3/uL (ref 0.00–0.07)
Basophils Absolute: 0.1 10*3/uL (ref 0.0–0.1)
Basophils Relative: 1 %
EOS ABS: 0.3 10*3/uL (ref 0.0–0.5)
Eosinophils Relative: 4 %
HCT: 40.8 % (ref 39.0–52.0)
Hemoglobin: 13 g/dL (ref 13.0–17.0)
Immature Granulocytes: 0 %
Lymphocytes Relative: 26 %
Lymphs Abs: 1.8 10*3/uL (ref 0.7–4.0)
MCH: 27.4 pg (ref 26.0–34.0)
MCHC: 31.9 g/dL (ref 30.0–36.0)
MCV: 85.9 fL (ref 80.0–100.0)
Monocytes Absolute: 0.8 10*3/uL (ref 0.1–1.0)
Monocytes Relative: 12 %
Neutro Abs: 3.9 10*3/uL (ref 1.7–7.7)
Neutrophils Relative %: 57 %
PLATELETS: 337 10*3/uL (ref 150–400)
RBC: 4.75 MIL/uL (ref 4.22–5.81)
RDW: 13.4 % (ref 11.5–15.5)
WBC: 6.9 10*3/uL (ref 4.0–10.5)
nRBC: 0 % (ref 0.0–0.2)

## 2018-04-26 LAB — COMPREHENSIVE METABOLIC PANEL
ALT: 29 U/L (ref 0–44)
AST: 25 U/L (ref 15–41)
Albumin: 3.7 g/dL (ref 3.5–5.0)
Alkaline Phosphatase: 88 U/L (ref 38–126)
Anion gap: 9 (ref 5–15)
BUN: 22 mg/dL — ABNORMAL HIGH (ref 6–20)
CO2: 26 mmol/L (ref 22–32)
Calcium: 8.9 mg/dL (ref 8.9–10.3)
Chloride: 101 mmol/L (ref 98–111)
Creatinine, Ser: 1.25 mg/dL — ABNORMAL HIGH (ref 0.61–1.24)
GFR calc Af Amer: >60 mL/min (ref >60)
GFR calc non Af Amer: >60 mL/min (ref >60)
Glucose, Bld: 88 mg/dL (ref 70–99)
Potassium: 4 mmol/L (ref 3.5–5.1)
Sodium: 136 mmol/L (ref 135–145)
TOTAL PROTEIN: 6.5 g/dL (ref 6.5–8.1)
Total Bilirubin: 0.8 mg/dL (ref 0.3–1.2)

## 2018-04-26 LAB — TROPONIN I: Troponin I: <0.03 ng/mL (ref <0.03)

## 2018-04-26 LAB — HIV ANTIBODY (ROUTINE TESTING W REFLEX): HIV Screen 4th Generation wRfx: NONREACTIVE

## 2018-04-26 NOTE — Discharge Instructions (Signed)
Drink plenty of water.

## 2018-04-26 NOTE — Discharge Summary (Signed)
Physician Discharge Summary  Antonio Nguyen ZOX:[id number] DOB: [birth year] DOA: 04/25/2018  PCP: Patient, No Pcp Per  Admit date: 04/25/2018  Discharge date: 04/26/2018  Admitted From: Home  Disposition:  Home  Discharge Condition: Stable  CODE STATUS:  Full  Diet recommendation: Heart Healthy   Brief/Interim Summary:  Antonio Nguyen is a 47 y.o. male with Past medical history of bipolar disorder, CAD S/P aspiration thrombectomy and PTCA of PDA, chronic systolic CHF, HLD presented to hospital with left-sided chest pain.  Troponins were negative.  EKG did not show any acute changes.  Cardiology was consulted due to his significant prior history.  Cardiology did not recommend further testing.  He did have mild AKI which improved with IV fluids.  At this time, patient is chest pain-free and does not have any dyspnea.  His renal failure has improved.  He has been considered stable disposition home.  He was advised to follow-up with his primary care physician in 1 week.  Patient was advised to keep an appointment with his cardiologist as outpatient.  Discharge Diagnoses:  Principal Problem:   Chest pain, atypical Active Problems:   Dyslipidemia, goal LDL below 70   History of bipolar disorder   Ischemic cardiomyopathy   CAD (coronary artery disease), native coronary artery   AKI (acute kidney injury) [facility])   Discharge Instructions  Discharge Instructions    Diet - low sodium heart healthy   Complete by:  As directed    Discharge instructions   Complete by:  As directed    Follow up with your cardiologist as scheduled.   Increase activity slowly   Complete by:  As directed      Allergies as of 04/26/2018   No Known Allergies     Medication List    TAKE these medications   ARIPiprazole 10 MG tablet Commonly known as:  ABILIFY Take 10 mg by mouth at bedtime.   aspirin 81 MG chewable tablet Chew 1 tablet (81 mg total) by mouth daily.   atorvastatin 80 MG tablet Commonly  known as:  LIPITOR Take 1 tablet (80 mg total) by mouth daily at 6 PM.   benztropine 1 MG tablet Commonly known as:  COGENTIN Take 1 mg by mouth 2 (two) times daily as needed (muscle tension/shaking/jitters).   clopidogrel 75 MG tablet Commonly known as:  PLAVIX Take 1 tablet (75 mg total) by mouth daily with breakfast.   metoprolol tartrate 25 MG tablet Commonly known as:  LOPRESSOR Take 0.5 tablets (12.5 mg total) by mouth 2 (two) times daily.   nitroGLYCERIN 0.4 MG SL tablet Commonly known as:  NITROSTAT Place 1 tablet (0.4 mg total) under the tongue every 5 (five) minutes as needed.   pantoprazole 40 MG tablet Commonly known as:  PROTONIX Take 40 mg by mouth daily before supper.      Follow-up Information    Jonelle Sidle, MD. Schedule an appointment as soon as possible for a visit in 1 week(s).   Specialty:  Cardiology Contact information: [address] [place] [address] [phone]          No Known Allergies  Consultations:  Cardiology    Procedures/Studies: Dg Chest 2 View  Result Date: 04/25/2018 CLINICAL DATA:  Chest pain. EXAM: CHEST - 2 VIEW COMPARISON:  Radiographs of January 25, 2018. FINDINGS: The heart size and mediastinal contours are within normal limits. Both lungs are clear. No pneumothorax or pleural effusion is noted. The visualized skeletal structures are unremarkable. IMPRESSION: No  active cardiopulmonary disease. Electronically Signed   By: Lupita RaiderJames  Green Jr, M.D.   On: 04/25/2018 14:46   [place] Renal  Result Date: 04/25/2018 CLINICAL DATA:  Acute renal insufficiency.  Hypertension. EXAM: RENAL / URINARY TRACT ULTRASOUND COMPLETE COMPARISON:  None. FINDINGS: Right Kidney: Renal measurements: 12.7 x 5.2 x 6.0 cm = volume: 210.6 mL . Echogenicity and renal cortical thickness are within normal limits. No mass, perinephric fluid, or hydronephrosis visualized. No sonographically demonstrable calculus or ureterectasis. Left Kidney: Renal  measurements: 11.6 x 5.8 x 5.2 cm = volume: 185.2 mL. Echogenicity and renal cortical thickness are within normal limits. No mass, perinephric fluid, or hydronephrosis visualized. No sonographically demonstrable calculus or ureterectasis. Bladder: Appears normal for degree of bladder distention. IMPRESSION: Study within normal limits. Electronically Signed   By: Bretta BangWilliam  Woodruff III M.D.   On: 04/25/2018 18:15      Subjective: Patient denies any chest pain, shortness of breath.  Discharge Exam: Vitals:   04/26/18 0215 04/26/18 0600  BP: 100/66 96/64  Pulse: 70 76  Resp: 18 20  Temp: 98.2 F (36.8 C) 98 F (36.7 C)  SpO2: 97% 95%   Vitals:   04/25/18 1815 04/25/18 2238 04/26/18 0215 04/26/18 0600  BP: 110/75 (!) 93/56 100/66 96/64  Pulse: 79 84 70 76  Resp:   18 20  Temp: 98.2 F (36.8 C) 98 F (36.7 C) 98.2 F (36.8 C) 98 F (36.7 C)  TempSrc: Oral Oral Oral Oral  SpO2: 97% 96% 97% 95%  Weight:      Height:        General: Pt is alert, awake, not in acute distress Cardiovascular: RRR, S1/S2 +, no rubs, no gallops Respiratory: CTA bilaterally, no wheezing, no rhonchi Abdominal: Soft, NT, ND, bowel sounds + CNS: non focal. Extremities: no edema, no cyanosis  The results of significant diagnostics from this hospitalization (including imaging, microbiology, ancillary and laboratory) are listed below for reference.    Microbiology: No results found for this or any previous visit (from the past 240 hour(s)).   Labs: BNP (last 3 results) Recent Labs    01/25/18 0931 04/25/18 1254  BNP 141.7* 49.5   Basic Metabolic Panel: Recent Labs  Lab 04/25/18 1254 04/25/18 1821 04/26/18 0450  NA 134* 136 136  K 4.1 3.8 4.0  CL 98 100 101  CO2 24 21* 26  GLUCOSE 92 83 88  BUN 28* 26* 22*  CREATININE 2.58* 1.76* 1.25*  CALCIUM 8.9 9.0 8.9   Liver Function Tests: Recent Labs  Lab 04/26/18 0450  AST 25  ALT 29  ALKPHOS 88  BILITOT 0.8  PROT 6.5  ALBUMIN 3.7    No results for input(s): LIPASE, AMYLASE in the last 168 hours. No results for input(s): AMMONIA in the last 168 hours. CBC: Recent Labs  Lab 04/25/18 1254 04/26/18 0450  WBC 9.3 6.9  NEUTROABS  --  3.9  HGB 13.3 13.0  HCT 42.2 40.8  MCV 87.0 85.9  PLT 339 337   Cardiac Enzymes: Recent Labs  Lab 04/25/18 1821 04/26/18 0037 04/26/18 0450  CKTOTAL 437*  --   --   CKMB 11.2*  --   --   TROPONINI <0.03 <0.03 <0.03   BNP: Invalid input(s): POCBNP CBG: No results for input(s): GLUCAP in the last 168 hours. D-Dimer Recent Labs    04/25/18 1319  DDIMER 0.34   Hgb A1c No results for input(s): HGBA1C in the last 72 hours. Lipid Profile No results for input(s): CHOL,  HDL, LDLCALC, TRIG, CHOLHDL, LDLDIRECT in the last 72 hours. Thyroid function studies No results for input(s): TSH, T4TOTAL, T3FREE, THYROIDAB in the last 72 hours.  Invalid input(s): FREET3 Anemia work up No results for input(s): VITAMINB12, FOLATE, FERRITIN, TIBC, IRON, RETICCTPCT in the last 72 hours. Urinalysis    Component Value Date/Time   COLORURINE YELLOW 04/25/2018 1550   APPEARANCEUR CLEAR 04/25/2018 1550   LABSPEC 1.013 04/25/2018 1550   PHURINE 5.0 04/25/2018 1550   GLUCOSEU NEGATIVE 04/25/2018 1550   HGBUR SMALL (A) 04/25/2018 1550   BILIRUBINUR NEGATIVE 04/25/2018 1550   KETONESUR 5 (A) 04/25/2018 1550   PROTEINUR NEGATIVE 04/25/2018 1550   NITRITE NEGATIVE 04/25/2018 1550   LEUKOCYTESUR NEGATIVE 04/25/2018 1550   Sepsis Labs Invalid input(s): PROCALCITONIN,  WBC,  LACTICIDVEN Microbiology No results found for this or any previous visit (from the past 240 hour(s)).  Please note: You were cared for by a hospitalist during your hospital stay. Once you are discharged, your primary care physician will handle any further medical issues.   Time coordinating discharge: 40 minutes  SIGNED:  Joycelyn Das, MD  [facility] 04/26/2018, 9:26 AM

## 2018-04-26 NOTE — Progress Notes (Signed)
Progress Note  Patient Name: Antonio Nguyen Date of Encounter: 04/26/2018  Primary Cardiologist: Nona Dell, MD   Subjective   No chest pain this am. No dyspnea.   Inpatient Medications    Scheduled Meds: . ARIPiprazole  10 mg Oral QHS  . aspirin  81 mg Oral Daily  . atorvastatin  80 mg Oral q1800  . clopidogrel  75 mg Oral Q breakfast  . heparin  5,000 Units Subcutaneous Q8H  . metoprolol tartrate  12.5 mg Oral BID  . pantoprazole  40 mg Oral QAC supper   Continuous Infusions: . sodium chloride 75 mL/hr at 04/26/18 0805   PRN Meds: acetaminophen, alum & mag hydroxide-simeth, benztropine, morphine injection, nitroGLYCERIN, ondansetron (ZOFRAN) IV   Vital Signs    Vitals:   04/25/18 1815 04/25/18 2238 04/26/18 0215 04/26/18 0600  BP: 110/75 (!) 93/56 100/66 96/64  Pulse: 79 84 70 76  Resp:   18 20  Temp: 98.2 F (36.8 C) 98 F (36.7 C) 98.2 F (36.8 C) 98 F (36.7 C)  TempSrc: Oral Oral Oral Oral  SpO2: 97% 96% 97% 95%  Weight:      Height:        Intake/Output Summary (Last 24 hours) at 04/26/2018 0838 Last data filed at 04/26/2018 0805 Gross per 24 hour  Intake 1525.89 ml  Output -  Net 1525.89 ml   Filed Weights   04/25/18 1211  Weight: 90.7 kg    Telemetry    sinus - Personally Reviewed  ECG    No AM EKG  Physical Exam   GEN: No acute distress.   Neck: No JVD Cardiac: RRR, no murmurs, rubs, or gallops.  Respiratory: Clear to auscultation bilaterally. GI: Soft, nontender, non-distended  MS: No edema; No deformity. Neuro:  Nonfocal  Psych: Normal affect   Labs    Chemistry Recent Labs  Lab 04/25/18 1254 04/25/18 1821 04/26/18 0450  NA 134* 136 136  K 4.1 3.8 4.0  CL 98 100 101  CO2 24 21* 26  GLUCOSE 92 83 88  BUN 28* 26* 22*  CREATININE 2.58* 1.76* 1.25*  CALCIUM 8.9 9.0 8.9  PROT  --   --  6.5  ALBUMIN  --   --  3.7  AST  --   --  25  ALT  --   --  29  ALKPHOS  --   --  88  BILITOT  --   --  0.8  GFRNONAA 28*  45* >60  GFRAA 33* 52* >60  ANIONGAP 12 15 9      Hematology Recent Labs  Lab 04/25/18 1254 04/26/18 0450  WBC 9.3 6.9  RBC 4.85 4.75  HGB 13.3 13.0  HCT 42.2 40.8  MCV 87.0 85.9  MCH 27.4 27.4  MCHC 31.5 31.9  RDW 13.3 13.4  PLT 339 337    Cardiac Enzymes Recent Labs  Lab 04/25/18 1821 04/26/18 0037 04/26/18 0450  TROPONINI <0.03 <0.03 <0.03    Recent Labs  Lab 04/25/18 1338 04/25/18 1644  TROPIPOC 0.03 0.02     BNP Recent Labs  Lab 04/25/18 1254  BNP 49.5     DDimer  Recent Labs  Lab 04/25/18 1319  DDIMER 0.34     Radiology    Dg Chest 2 View  Result Date: 04/25/2018 CLINICAL DATA:  Chest pain. EXAM: CHEST - 2 VIEW COMPARISON:  Radiographs of January 25, 2018. FINDINGS: The heart size and mediastinal contours are within normal limits. Both lungs are clear. No  pneumothorax or pleural effusion is noted. The visualized skeletal structures are unremarkable. IMPRESSION: No active cardiopulmonary disease. Electronically Signed   By: Lupita RaiderJames  Green Jr, M.D.   On: 04/25/2018 14:46   [place] Renal  Result Date: 04/25/2018 CLINICAL DATA:  Acute renal insufficiency.  Hypertension. EXAM: RENAL / URINARY TRACT ULTRASOUND COMPLETE COMPARISON:  None. FINDINGS: Right Kidney: Renal measurements: 12.7 x 5.2 x 6.0 cm = volume: 210.6 mL . Echogenicity and renal cortical thickness are within normal limits. No mass, perinephric fluid, or hydronephrosis visualized. No sonographically demonstrable calculus or ureterectasis. Left Kidney: Renal measurements: 11.6 x 5.8 x 5.2 cm = volume: 185.2 mL. Echogenicity and renal cortical thickness are within normal limits. No mass, perinephric fluid, or hydronephrosis visualized. No sonographically demonstrable calculus or ureterectasis. Bladder: Appears normal for degree of bladder distention. IMPRESSION: Study within normal limits. Electronically Signed   By: Bretta BangWilliam  Woodruff III M.D.   On: 04/25/2018 18:15    Cardiac Studies      Patient Profile     47 y.o. male with history of CAD, mild ischemic cardiomyopathy, HTN, HLD admitted with chest pain and acute renal failure. No evidence of ACS on admission.   Assessment & Plan    1. CAD 2. Chest pain -atypical. Troponin negative x 3. EKG on admission without ischemic changes. Chest pain resolved. I do not think his chest pain was cardiac. OK to discharge home today and keep regularly scheduled cardiac follow up.   [facility] will sign off.   Medication Recommendations:  Continue current meds Other recommendations (labs, testing, etc):  none Follow up as an outpatient:  As planned  For questions or updates, please contact [facility] Please consult [email or web address] for contact info under        Signed, Verne Carrowhristopher Nykia Turko, MD  04/26/2018, 8:38 AM

## 2018-04-26 NOTE — Progress Notes (Signed)
Hurts patient to start urinating.

## 2018-10-13 ENCOUNTER — Telehealth: Payer: Self-pay | Admitting: Cardiology

## 2018-10-13 NOTE — Telephone Encounter (Signed)
Patient called stating that he went to [facility] ER on 10-11-2018 for chest pains.  States that his employer needs a note stating that he can go back to work. Please call [phone].

## 2018-10-13 NOTE — Telephone Encounter (Signed)
Spoke with pt who states that he no longer needs note for work d/t testing positive for COVID 19.

## 2018-11-16 ENCOUNTER — Ambulatory Visit: Payer: Medicaid - Out of State | Admitting: Cardiology

## 2018-11-16 ENCOUNTER — Encounter: Payer: Self-pay | Admitting: Cardiology

## 2018-11-16 NOTE — Progress Notes (Deleted)
Cardiology Office Note  Date: 11/16/2018   ID: Antonio Nguyen, DOB Sep 15, 1970, MRN [id number]  PCP:  Patient, No Pcp Per  Cardiologist:  Nona DellSamuel Nadia Viar, MD Electrophysiologist:  None   No chief complaint on file.   History of Present Illness: Antonio Nguyen is a 48 y.o. male last seen in October 2019.  He is due for a follow-up lipid panel.  Past Medical History:  Diagnosis Date  . Bipolar disorder (HCC)   . Essential hypertension   . Hyperlipidemia   . ST elevation myocardial infarction (STEMI) of inferior wall (HCC)    11/22/17 PTCA and thrombectomy to the PDA, PLOM branch. EF 45-50%    Past Surgical History:  Procedure Laterality Date  . CORONARY BALLOON ANGIOPLASTY N/A 11/21/2017   Procedure: CORONARY BALLOON ANGIOPLASTY;  Surgeon: SwazilandJordan, Peter M, MD;  [facility];  Service: Cardiovascular;  Laterality: N/A;  . CORONARY THROMBECTOMY N/A 11/21/2017   Procedure: Coronary Thrombectomy;  Surgeon: SwazilandJordan, Peter M, MD;  [facility];  Service: Cardiovascular;  Laterality: N/A;  . LEFT HEART CATH AND CORONARY ANGIOGRAPHY N/A 11/21/2017   Procedure: LEFT HEART CATH AND CORONARY ANGIOGRAPHY;  Surgeon: SwazilandJordan, Peter M, MD;  [facility];  Service: Cardiovascular;  Laterality: N/A;    Current Outpatient Medications  Medication Sig Dispense Refill  . ARIPiprazole (ABILIFY) 10 MG tablet Take 10 mg by mouth at bedtime.    Antonio Nguyen. aspirin 81 MG chewable tablet Chew 1 tablet (81 mg total) by mouth daily.    Antonio Nguyen. atorvastatin (LIPITOR) 80 MG tablet Take 1 tablet (80 mg total) by mouth daily at 6 PM. 90 tablet 1  . benztropine (COGENTIN) 1 MG tablet Take 1 mg by mouth 2 (two) times daily as needed (muscle tension/shaking/jitters).    . clopidogrel (PLAVIX) 75 MG tablet Take 1 tablet (75 mg total) by mouth daily with breakfast. 90 tablet 2  . metoprolol tartrate (LOPRESSOR) 25 MG tablet Take 0.5 tablets (12.5 mg total) by mouth 2 (two) times daily. 60  tablet 1  . nitroGLYCERIN (NITROSTAT) 0.4 MG SL tablet Place 1 tablet (0.4 mg total) under the tongue every 5 (five) minutes as needed. 25 tablet 2  . pantoprazole (PROTONIX) 40 MG tablet Take 40 mg by mouth daily before supper.     No current facility-administered medications for this visit.    Allergies:  Patient has no known allergies.   Social History: The patient  reports that he has quit smoking. His smoking use included cigarettes. He has a 20.00 pack-year smoking history. He has never used smokeless tobacco. He reports that he does not drink alcohol or use drugs.   Family History: The patient's family history includes Hypertension in his father.   ROS:  Please see the history of present illness. Otherwise, complete review of systems is positive for {NONE DEFAULTED:18576::"none"}.  All other systems are reviewed and negative.   Physical Exam: VS:  There were no vitals taken for this visit., BMI There is no height or weight on file to calculate BMI.  Wt Readings from Last 3 Encounters:  04/25/18 200 lb (90.7 kg)  03/10/18 200 lb (90.7 kg)  01/26/18 199 lb 8 oz (90.5 kg)    General: Patient appears comfortable at rest. HEENT: Conjunctiva and lids normal, oropharynx clear with moist mucosa. Neck: Supple, no elevated JVP or carotid bruits, no thyromegaly. Lungs: Clear to auscultation, nonlabored breathing at rest. Cardiac: Regular rate and rhythm, no S3  or significant systolic murmur, no pericardial rub. Abdomen: Soft, nontender, no hepatomegaly, bowel sounds present, no guarding or rebound. Extremities: No pitting edema, distal pulses 2+. Skin: Warm and dry. Musculoskeletal: No kyphosis. Neuropsychiatric: Alert and oriented x3, affect grossly appropriate.  ECG:  An ECG dated 04/25/2018 was personally reviewed today and demonstrated:  Sinus rhythm with old inferior infarct pattern.  Recent Labwork: 01/25/2018: TSH 1.627 04/25/2018: B Natriuretic Peptide 49.5 04/26/2018: ALT  29; AST 25; BUN 22; Creatinine, Ser 1.25; Hemoglobin 13.0; Platelets 337; Potassium 4.0; Sodium 136     Component Value Date/Time   CHOL 165 11/21/2017 1545   TRIG 66 11/21/2017 1545   HDL 40 (L) 11/21/2017 1545   CHOLHDL 4.1 11/21/2017 1545   VLDL 13 11/21/2017 1545   LDLCALC 112 (H) 11/21/2017 1545    Other Studies Reviewed Today:  Echocardiogram 11/22/2017: Study Conclusions  - Left ventricle: The cavity size was normal. Wall thickness was normal. Systolic function was mildly reduced. The estimated ejection fraction was in the range of 45% to 50%. Hypokinesis of the inferior and inferoseptal myocardium. - Right ventricle: The cavity size was mildly dilated. Wall thickness was normal. Systolic function was moderately to severely reduced.  Impressions:  - Focal wall motion abnormalities in inferior and inferoseptal walls with overall mild decrease in LV ejection fraction. RV systolic function severely reduced, best seen in subcostal windows.  Cardiac catheterization and PCI 11/21/2017:  Prox RCA lesion is 25% stenosed.  1st RPLB lesion is 100% stenosed.  RPDA lesion is 100% stenosed.  Balloon angioplasty was performed using a BALLOON EMERGE MR 2.0X12.  Post intervention, there is a 0% residual stenosis.  LV end diastolic pressure is normal.  1. Acute inferior STEMI secondary to distal embolization of thrombus into the PDA and PLOM branches from a nonobstructive ulcerative plaque in the proximal RCA 2. Normal LVEDP 3. LV gram not performed due to high creatinine 4. Successful reperfusion of the PDA with aspiration thrombectomy and PTCA  Assessment and Plan:   Medication Adjustments/Labs and Tests Ordered: Current medicines are reviewed at length with the patient today.  Concerns regarding medicines are outlined above.   Tests Ordered: No orders of the defined types were placed in this encounter.   Medication Changes: No orders of the  defined types were placed in this encounter.   Disposition:  Follow up {follow up:15908}  Signed, Satira Sark, MD, [facility] 11/16/2018 10:10 AM    [facility] [place]. [address], [place], [place] [address] Phone: [phone]; Fax: [phone]

## 2018-12-11 NOTE — Progress Notes (Deleted)
{Choose 1 Note Type (Telehealth Visit or Telephone Visit):[phone]}   Date:  12/11/2018   ID:  Antonio BoastBrian M Nathanson, DOB 1970-10-01, MRN [id number]  {Patient Location:[phone]::"Home"} {Provider Location:[phone]::"Home"}  PCP:  Patient, No Pcp Per  Cardiologist:  Nona Dell , MD Electrophysiologist:  None   Evaluation Performed:  {Choose Visit Type:[phone]::"Follow-Up Visit"}  Chief Complaint:  ***  History of Present Illness:    Antonio Nguyen is a 48 y.o. male last seen in October 2019.  We discussed obtaining a follow-up lipid profile.  He states that he tested positive for COVID-19 back in May.   Past Medical History:  Diagnosis Date  . Bipolar disorder (HCC)   . Essential hypertension   . Hyperlipidemia   . ST elevation myocardial infarction (STEMI) of inferior wall (HCC)    11/22/17 PTCA and thrombectomy to the PDA, PLOM branch. EF 45-50%   Past Surgical History:  Procedure Laterality Date  . CORONARY BALLOON ANGIOPLASTY N/A 11/21/2017   Procedure: CORONARY BALLOON ANGIOPLASTY;  Surgeon: SwazilandJordan, Peter M, MD;  [facility];  Service: Cardiovascular;  Laterality: N/A;  . CORONARY THROMBECTOMY N/A 11/21/2017   Procedure: Coronary Thrombectomy;  Surgeon: SwazilandJordan, Peter M, MD;  [facility];  Service: Cardiovascular;  Laterality: N/A;  . LEFT HEART CATH AND CORONARY ANGIOGRAPHY N/A 11/21/2017   Procedure: LEFT HEART CATH AND CORONARY ANGIOGRAPHY;  Surgeon: SwazilandJordan, Peter M, MD;  [facility];  Service: Cardiovascular;  Laterality: N/A;     No outpatient medications have been marked as taking for the 12/12/18 encounter (Appointment) with Jonelle Sidle,  G, MD.     Allergies:   Patient has no known allergies.   Social History   Tobacco Use  . Smoking status: Former Smoker    Packs/day: 1.00    Years: 20.00    Pack years: 20.00    Types: Cigarettes  . Smokeless tobacco: Never Used  . Tobacco comment: already quit smoking   Substance Use Topics  . Alcohol use: Never    Frequency: Never  . Drug use: Never     Family Hx: The patient's family history includes Hypertension in his father.  ROS:   Please see the history of present illness.    *** All other systems reviewed and are negative.   Prior CV studies:   The following studies were reviewed today:  Echocardiogram 11/22/2017: Study Conclusions  - Left ventricle: The cavity size was normal. Wall thickness was normal. Systolic function was mildly reduced. The estimated ejection fraction was in the range of 45% to 50%. Hypokinesis of the inferior and inferoseptal myocardium. - Right ventricle: The cavity size was mildly dilated. Wall thickness was normal. Systolic function was moderately to severely reduced.  Impressions:  - Focal wall motion abnormalities in inferior and inferoseptal walls with overall mild decrease in LV ejection fraction. RV systolic function severely reduced, best seen in subcostal windows.  Cardiac catheterization and PCI 11/21/2017:  Prox RCA lesion is 25% stenosed.  1st RPLB lesion is 100% stenosed.  RPDA lesion is 100% stenosed.  Balloon angioplasty was performed using a BALLOON EMERGE MR 2.0X12.  Post intervention, there is a 0% residual stenosis.  LV end diastolic pressure is normal.  1. Acute inferior STEMI secondary to distal embolization of thrombus into the PDA and PLOM branches from a nonobstructive ulcerative plaque in the proximal RCA 2. Normal LVEDP 3. LV gram not performed due to high creatinine 4. Successful reperfusion of the PDA with aspiration thrombectomy  and PTCA  Labs/Other Tests and Data Reviewed:    EKG:  An ECG dated 04/25/2018 was personally reviewed today and demonstrated:  Sinus rhythm with old inferior infarct pattern.  Recent Labs: 01/25/2018: TSH 1.627 04/25/2018: B Natriuretic Peptide 49.5 04/26/2018: ALT 29; BUN 22; Creatinine, Ser 1.25; Hemoglobin 13.0;  Platelets 337; Potassium 4.0; Sodium 136   Recent Lipid Panel Lab Results  Component Value Date/Time   CHOL 165 11/21/2017 03:45 PM   TRIG 66 11/21/2017 03:45 PM   HDL 40 (L) 11/21/2017 03:45 PM   CHOLHDL 4.1 11/21/2017 03:45 PM   LDLCALC 112 (H) 11/21/2017 03:45 PM    Wt Readings from Last 3 Encounters:  04/25/18 200 lb (90.7 kg)  03/10/18 200 lb (90.7 kg)  01/26/18 199 lb 8 oz (90.5 kg)     Objective:    Vital Signs:  There were no vitals taken for this visit.   {[facility] Virtual Exam (Optional):[phone]::"VITAL SIGNS:  reviewed"}  ASSESSMENT & PLAN:    1. ***  COVID-19 Education: The signs and symptoms of COVID-19 were discussed with the patient and how to seek care for testing (follow up with PCP or arrange E-visit).  ***The importance of social distancing was discussed today.  Time:   Today, I have spent *** minutes with the patient with telehealth technology discussing the above problems.     Medication Adjustments/Labs and Tests Ordered: Current medicines are reviewed at length with the patient today.  Concerns regarding medicines are outlined above.   Tests Ordered: No orders of the defined types were placed in this encounter.   Medication Changes: No orders of the defined types were placed in this encounter.   Follow Up:  {F/U [phone]} {follow up:15908}  Signed, Rozann Lesches, MD  12/11/2018 3:41 PM    [place]

## 2018-12-12 ENCOUNTER — Telehealth: Payer: Medicaid - Out of State | Admitting: Cardiology

## 2018-12-13 ENCOUNTER — Other Ambulatory Visit: Payer: Self-pay

## 2018-12-13 ENCOUNTER — Encounter: Payer: Self-pay | Admitting: Cardiology

## 2018-12-13 ENCOUNTER — Telehealth: Payer: Self-pay | Admitting: Licensed Clinical Social Worker

## 2018-12-13 ENCOUNTER — Telehealth (INDEPENDENT_AMBULATORY_CARE_PROVIDER_SITE_OTHER): Payer: Medicaid - Out of State | Admitting: Cardiology

## 2018-12-13 VITALS — Ht 75.0 in | Wt 195.0 lb

## 2018-12-13 DIAGNOSIS — Z8659 Personal history of other mental and behavioral disorders: Secondary | ICD-10-CM

## 2018-12-13 DIAGNOSIS — E782 Mixed hyperlipidemia: Secondary | ICD-10-CM

## 2018-12-13 DIAGNOSIS — I25119 Atherosclerotic heart disease of native coronary artery with unspecified angina pectoris: Secondary | ICD-10-CM | POA: Diagnosis not present

## 2018-12-13 DIAGNOSIS — I10 Essential (primary) hypertension: Secondary | ICD-10-CM | POA: Diagnosis not present

## 2018-12-13 MED ORDER — METOPROLOL TARTRATE 25 MG PO TABS: 12.5000 mg | ORAL_TABLET | Freq: Two times a day (BID) | ORAL | 3 refills | Status: DC

## 2018-12-13 MED ORDER — ATORVASTATIN CALCIUM 80 MG PO TABS: 80.0000 mg | ORAL_TABLET | Freq: Every day | ORAL | 3 refills | Status: DC

## 2018-12-13 NOTE — Telephone Encounter (Signed)
CSW referred to assist patient with obtaining a BP cuff. CSW contacted patient to inform cuff will be delivered to home. Message left. CSW available as needed. Jackie Reida Hem, LCSW, CCSW-MCS [phone]  

## 2018-12-13 NOTE — Progress Notes (Signed)
.    Virtual Visit via Telephone Note   This visit type was conducted due to national recommendations for restrictions regarding the COVID-19 Pandemic (e.g. social distancing) in an effort to limit this patient's exposure and mitigate transmission in our community.  Due to his co-morbid illnesses, this patient is at least at moderate risk for complications without adequate follow up.  This format is felt to be most appropriate for this patient at this time.  The patient did not have access to video technology/had technical difficulties with video requiring transitioning to audio format only (telephone).  All issues noted in this document were discussed and addressed.  No physical exam could be performed with this format.  Please refer to the patient's chart for his  consent to telehealth [facility].   Date:  12/13/2018   ID:  Antonio BoastBrian M Batalla, DOB Feb 26, 1971, MRN [id number]  Patient Location: Home Provider Location: Office  PCP:  Patient, No Pcp Per  Cardiologist:  Nona DellSamuel McDowell, MD Electrophysiologist:  None   Evaluation Performed:  Follow-Up Visit  Chief Complaint:  Cardiac follow-up  History of Present Illness:    Antonio Nguyen is a 48 y.o. male last seen in October 2019.  He did not have video access and we spoke by phone today.  He states that he has had episodes of chest pain over the last 6 months, feels like they are related to anxiety.  No definite exertional component.  He also tells me that he has not taken any of his cardiac medications for the last few months due to financial constraints.  He is seeing a behavioral health specialist but does not have a PCP.  We discussed obtaining a follow-up lipid profile.  At this point we will defer follow-up testing since he has not been on Lipitor consistently.  The patient does not have symptoms concerning for COVID-19 infection (fever, chills, cough, or new shortness of breath).  He states that he wears a mask when he goes out.   Past Medical History:  Diagnosis Date  . Bipolar disorder (HCC)   . Essential hypertension   . Hyperlipidemia   . ST elevation myocardial infarction (STEMI) of inferior wall (HCC)    11/22/17 PTCA and thrombectomy to the PDA, PLOM branch. EF 45-50%   Past Surgical History:  Procedure Laterality Date  . CORONARY BALLOON ANGIOPLASTY N/A 11/21/2017   Procedure: CORONARY BALLOON ANGIOPLASTY;  Surgeon: SwazilandJordan, Peter M, MD;  [facility];  Service: Cardiovascular;  Laterality: N/A;  . CORONARY THROMBECTOMY N/A 11/21/2017   Procedure: Coronary Thrombectomy;  Surgeon: SwazilandJordan, Peter M, MD;  [facility];  Service: Cardiovascular;  Laterality: N/A;  . LEFT HEART CATH AND CORONARY ANGIOGRAPHY N/A 11/21/2017   Procedure: LEFT HEART CATH AND CORONARY ANGIOGRAPHY;  Surgeon: SwazilandJordan, Peter M, MD;  [facility];  Service: Cardiovascular;  Laterality: N/A;     Current Meds  Medication Sig  . ARIPiprazole (ABILIFY) 10 MG tablet Take 10 mg by mouth at bedtime.  Marland Kitchen. aspirin 81 MG chewable tablet Chew 1 tablet (81 mg total) by mouth daily.  Marland Kitchen. atorvastatin (LIPITOR) 80 MG tablet Take 1 tablet (80 mg total) by mouth daily at 6 PM.  . benztropine (COGENTIN) 1 MG tablet Take 1 mg by mouth 2 (two) times daily as needed (muscle tension/shaking/jitters).  . metoprolol tartrate (LOPRESSOR) 25 MG tablet Take 0.5 tablets (12.5 mg total) by mouth 2 (two) times daily.  . nitroGLYCERIN (NITROSTAT)  0.4 MG SL tablet Place 1 tablet (0.4 mg total) under the tongue every 5 (five) minutes as needed.  . pantoprazole (PROTONIX) 40 MG tablet Take 40 mg by mouth daily before supper.  . [DISCONTINUED] atorvastatin (LIPITOR) 80 MG tablet Take 1 tablet (80 mg total) by mouth daily at 6 PM.  . [DISCONTINUED] metoprolol tartrate (LOPRESSOR) 25 MG tablet Take 0.5 tablets (12.5 mg total) by mouth 2 (two) times daily.     Allergies:   Patient has no known allergies.   Social History   Tobacco  Use  . Smoking status: Former Smoker    Packs/day: 1.00    Years: 20.00    Pack years: 20.00    Types: Cigarettes  . Smokeless tobacco: Never Used  . Tobacco comment: already quit smoking  Substance Use Topics  . Alcohol use: Never    Frequency: Never  . Drug use: Never     Family Hx: The patient's family history includes Hypertension in his father.  ROS:   Please see the history of present illness.    Anxiety. All other systems reviewed and are negative.   Prior CV studies:   The following studies were reviewed today:  Echocardiogram 11/22/2017: Study Conclusions  - Left ventricle: The cavity size was normal. Wall thickness was normal. Systolic function was mildly reduced. The estimated ejection fraction was in the range of 45% to 50%. Hypokinesis of the inferior and inferoseptal myocardium. - Right ventricle: The cavity size was mildly dilated. Wall thickness was normal. Systolic function was moderately to severely reduced.  Impressions:  - Focal wall motion abnormalities in inferior and inferoseptal walls with overall mild decrease in LV ejection fraction. RV systolic function severely reduced, best seen in subcostal windows.  Cardiac catheterization and PCI 11/21/2017:  Prox RCA lesion is 25% stenosed.  1st RPLB lesion is 100% stenosed.  RPDA lesion is 100% stenosed.  Balloon angioplasty was performed using a BALLOON EMERGE MR 2.0X12.  Post intervention, there is a 0% residual stenosis.  LV end diastolic pressure is normal.  1. Acute inferior STEMI secondary to distal embolization of thrombus into the PDA and PLOM branches from a nonobstructive ulcerative plaque in the proximal RCA 2. Normal LVEDP 3. LV gram not performed due to high creatinine 4. Successful reperfusion of the PDA with aspiration thrombectomy and PTCA  Labs/Other Tests and Data Reviewed:    EKG:  An ECG dated 04/25/2018 was personally reviewed today and  demonstrated:  Sinus rhythm with old inferior infarct pattern.  Recent Labs: 01/25/2018: TSH 1.627 04/25/2018: B Natriuretic Peptide 49.5 04/26/2018: ALT 29; BUN 22; Creatinine, Ser 1.25; Hemoglobin 13.0; Platelets 337; Potassium 4.0; Sodium 136   Recent Lipid Panel Lab Results  Component Value Date/Time   CHOL 165 11/21/2017 03:45 PM   TRIG 66 11/21/2017 03:45 PM   HDL 40 (L) 11/21/2017 03:45 PM   CHOLHDL 4.1 11/21/2017 03:45 PM   LDLCALC 112 (H) 11/21/2017 03:45 PM    Wt Readings from Last 3 Encounters:  12/13/18 195 lb (88.5 kg)  04/25/18 200 lb (90.7 kg)  03/10/18 200 lb (90.7 kg)     Objective:    Vital Signs:  Ht 6\' 3"  (1.905 m)   Wt 195 lb (88.5 kg)   BMI 24.37 kg/m    He did not have a way to check vital signs today. Patient spoke in full sentences, not short of breath. No audible wheezing or coughing. Speech pattern normal.  ASSESSMENT & PLAN:    1.  CAD with history of inferior STEMI in July 2019 with aspiration thrombectomy and PTCA of the RCA.  He has not been on regular medications for the last few months.  I asked him to go back on aspirin daily, we will stop Plavix at this time since he is over a year out from ACS.  He does have nitroglycerin available.  Also restarting Lipitor and Lopressor.  Office follow-up arranged.  2.  Mixed hyperlipidemia, he has not been taking Lipitor which will be resumed.  We will eventually follow-up with a lipid panel.  3.  Bipolar disorder, patient states that he follows with a behavioral specialist.  He states that he has been taking his medications related to this.  4.  Essential hypertension by history.  COVID-19 Education: The signs and symptoms of COVID-19 were discussed with the patient and how to seek care for testing (follow up with PCP or arrange E-visit).  The importance of social distancing was discussed today.  Time:   Today, I have spent 6 minutes with the patient with telehealth technology discussing the above  problems.     Medication Adjustments/Labs and Tests Ordered: Current medicines are reviewed at length with the patient today.  Concerns regarding medicines are outlined above.   Tests Ordered: No orders of the defined types were placed in this encounter.   Medication Changes: Meds ordered this encounter  Medications  . atorvastatin (LIPITOR) 80 MG tablet    Sig: Take 1 tablet (80 mg total) by mouth daily at 6 PM.    Dispense:  90 tablet    Refill:  3  . metoprolol tartrate (LOPRESSOR) 25 MG tablet    Sig: Take 0.5 tablets (12.5 mg total) by mouth 2 (two) times daily.    Dispense:  90 tablet    Refill:  3    Follow Up:  In Person 6 months in the [place] office.  Signed, Rozann Lesches, MD  12/13/2018 11:26 AM    [place]

## 2018-12-13 NOTE — Patient Instructions (Addendum)
Medication Instructions:  STOP Plavix   Please re-start Aspirin 81 mg daily  I refilled your Atorvastatin and Lopressor  Labwork: None today  Procedures/Testing: None today  Follow-Up:  6 months with Dr.McDowell   Any Additional Special Instructions Will Be Listed Below (If Applicable).     If you need a refill on your cardiac medications before your next appointment, please call your pharmacy.       Thank you for choosing Antonio Nguyen !

## 2018-12-28 ENCOUNTER — Other Ambulatory Visit: Payer: Self-pay | Admitting: Cardiology

## 2018-12-28 MED ORDER — PANTOPRAZOLE SODIUM 40 MG PO TBEC: 40.0000 mg | DELAYED_RELEASE_TABLET | Freq: Every day | ORAL | 3 refills | Status: DC

## 2018-12-28 NOTE — Telephone Encounter (Signed)
Refilled protonix

## 2018-12-28 NOTE — Telephone Encounter (Signed)
Needing refill for pantoprazole (PROTONIX) 40 MG tablet [[id number]]  Sent to [facility] on [address] in [place]

## 2019-01-19 ENCOUNTER — Telehealth: Payer: Self-pay | Admitting: Cardiology

## 2019-01-19 NOTE — Telephone Encounter (Signed)
Pt states [facility] never got his Rx for pantoprazole (PROTONIX) 40 MG tablet [[id number]]

## 2019-01-23 MED ORDER — PANTOPRAZOLE SODIUM 40 MG PO TBEC: 40.0000 mg | DELAYED_RELEASE_TABLET | Freq: Every day | ORAL | 3 refills | Status: DC

## 2019-01-23 NOTE — Telephone Encounter (Signed)
refilled 

## 2019-02-13 ENCOUNTER — Telehealth: Payer: Medicaid - Out of State | Admitting: Cardiology

## 2019-04-16 ENCOUNTER — Other Ambulatory Visit: Payer: Self-pay | Admitting: *Deleted

## 2019-04-16 MED ORDER — PANTOPRAZOLE SODIUM 40 MG PO TBEC: 40.0000 mg | DELAYED_RELEASE_TABLET | Freq: Every day | ORAL | 3 refills | Status: DC

## 2019-08-05 ENCOUNTER — Other Ambulatory Visit: Payer: Self-pay | Admitting: Cardiology

## 2019-11-04 NOTE — Progress Notes (Signed)
Cardiology Office Note  Date: 11/05/2019   ID: Antonio Nguyen, DOB [birth year], MRN [id number]  PCP:  Patient, No Pcp Per  Cardiologist:  Nona Dell, MD  Electrophysiologist:  None   Chief Complaint: Follow-up  CAD (history of inferior STEMI 2019 with aspiration thrombectomy and PTCA of the RCA), HTN, HLD.  History of Present Illness: Antonio Nguyen is a 49 y.o. male with a history of CAD (history of inferior STEMI 2019 with aspiration thrombectomy and PTCA of the RCA), HTN, HLD.  Last seen by Dr. Diona Browner 12/13/2018 via telemedicine: He stated he had some chest pain episodes over the previous 6 months and felt like they were related to anxiety.  No definite exertional component.  He had not taken any of his cardiac medications over the previous months due to financial constraints.  He was seeing a behavioral specialist for his bipolar disorder but did not have a PCP at the time.  Discussed getting a follow-up lipid profile however he had not been taking his Lipitor consistently.  He was asked to go back on aspirin daily and Plavix was DC'd.  He had sublingual nitroglycerin available.  He was restarting his Lipitor and Lopressor.  Stated would eventually follow-up with a lipid panel.  Patient had a recent episode of left-sided chest pain/pressure which he describes being in the left lateral chest extending to mid axillary line.  States it does not radiate.  There are no aggravating or alleviating factors.  Denies any radiation to neck, arms, back, jaw.  Denies any associated nausea, vomiting or diaphoresis.  He went to the emergency room in [facility] and had multiple labs including CBC, CMP, troponin x2, TSH, T4, magnesium.  He states his labs were all normal.  He had a chest x-ray which was normal per his report.  He continues to complain of vague left sided chest pain and places his hand over just below the left nipple line.  States he wanted to get this checked out since he had an  NSTEMI from a thrombus in 2019.  Denies any CVA or TIA-like symptoms, dizziness, PND, orthopnea, bleeding, claudication, DVT or PE-like symptoms, or lower extremity edema.   Past Medical History:  Diagnosis Date  . Bipolar disorder (HCC)   . Essential hypertension   . Hyperlipidemia   . ST elevation myocardial infarction (STEMI) of inferior wall (HCC)    11/22/17 PTCA and thrombectomy to the PDA, PLOM branch. EF 45-50%    Past Surgical History:  Procedure Laterality Date  . CORONARY BALLOON ANGIOPLASTY N/A 11/21/2017   Procedure: CORONARY BALLOON ANGIOPLASTY;  Surgeon: Swaziland, Peter M, MD;  Location: [facility] INVASIVE CV LAB;  Service: Cardiovascular;  Laterality: N/A;  . CORONARY THROMBECTOMY N/A 11/21/2017   Procedure: Coronary Thrombectomy;  Surgeon: Swaziland, Peter M, MD;  Location: [facility] INVASIVE CV LAB;  Service: Cardiovascular;  Laterality: N/A;  . LEFT HEART CATH AND CORONARY ANGIOGRAPHY N/A 11/21/2017   Procedure: LEFT HEART CATH AND CORONARY ANGIOGRAPHY;  Surgeon: Swaziland, Peter M, MD;  Location: [facility] INVASIVE CV LAB;  Service: Cardiovascular;  Laterality: N/A;    Current Outpatient Medications  Medication Sig Dispense Refill  . ARIPiprazole (ABILIFY) 10 MG tablet Take 10 mg by mouth at bedtime.    Marland Kitchen aspirin 81 MG chewable tablet Chew 1 tablet (81 mg total) by mouth daily.    Marland Kitchen atorvastatin (LIPITOR) 80 MG tablet Take 1 tablet (80 mg total) by mouth daily at 6 PM. 90 tablet 3  . benztropine (  COGENTIN) 1 MG tablet Take 1 mg by mouth 2 (two) times daily as needed (muscle tension/shaking/jitters).    . busPIRone (BUSPAR) 15 MG tablet Take 15 mg by mouth 3 (three) times daily.    . hydrOXYzine (ATARAX/VISTARIL) 10 MG tablet hydroxyzine HCl 10 mg tablet  TAKE 1 TABLET BY MOUTH 3 TIMES A DAY AS NEEDED FOR ANXIETY    . metoprolol tartrate (LOPRESSOR) 25 MG tablet Take 0.5 tablets (12.5 mg total) by mouth 2 (two) times daily. 90 tablet 3  . mirtazapine (REMERON) 45 MG tablet Take 45 mg by mouth at  bedtime.    . nitroGLYCERIN (NITROSTAT) 0.4 MG SL tablet Place 1 tablet (0.4 mg total) under the tongue every 5 (five) minutes as needed. 25 tablet 2  . pantoprazole (PROTONIX) 40 MG tablet TAKE 1 TABLET(40 MG) BY MOUTH DAILY BEFORE SUPPER 30 tablet 1  . ZYPREXA 10 MG tablet Take 10 mg by mouth at bedtime.     No current facility-administered medications for this visit.   Allergies:  Patient has no known allergies.   Social History: The patient  reports that he quit smoking about 5 years ago. His smoking use included cigarettes. He has a 20.00 pack-year smoking history. He has never used smokeless tobacco. He reports that he does not drink alcohol and does not use drugs.   Family History: The patient's family history includes Hypertension in his father.   ROS:  Please see the history of present illness. Otherwise, complete review of systems is positive for none.  All other systems are reviewed and negative.   Physical Exam: VS:  BP 118/82   Pulse 61   Ht 6\' 3"  (1.905 m)   Wt 206 lb 3.2 oz (93.5 kg)   SpO2 97%   BMI 25.77 kg/m , BMI Body mass index is 25.77 kg/m.  Wt Readings from Last 3 Encounters:  11/05/19 206 lb 3.2 oz (93.5 kg)  12/13/18 195 lb (88.5 kg)  04/25/18 200 lb (90.7 kg)    General: Patient appears comfortable at rest. Neck: Supple, no elevated JVP or carotid bruits, no thyromegaly. Lungs: Clear to auscultation, nonlabored breathing at rest. Cardiac: Regular rate and rhythm, no S3 or significant systolic murmur, no pericardial rub. Extremities: No pitting edema, distal pulses 2+. Skin: Warm and dry. Musculoskeletal: No kyphosis. Neuropsychiatric: Alert and oriented x3, affect grossly appropriate.  ECG:    Recent Labwork: No results found for requested labs within last 8760 hours.     Component Value Date/Time   CHOL 165 11/21/2017 1545   TRIG 66 11/21/2017 1545   HDL 40 (L) 11/21/2017 1545   CHOLHDL 4.1 11/21/2017 1545   VLDL 13 11/21/2017 1545    LDLCALC 112 (H) 11/21/2017 1545    Other Studies Reviewed Today: Echocardiogram 11/22/2017: Study Conclusions  - Left ventricle: The cavity size was normal. Wall thickness was normal. Systolic function was mildly reduced. The estimated ejection fraction was in the range of 45% to 50%. Hypokinesis of the inferior and inferoseptal myocardium. - Right ventricle: The cavity size was mildly dilated. Wall thickness was normal. Systolic function was moderately to severely reduced.  Impressions:  - Focal wall motion abnormalities in inferior and inferoseptal walls with overall mild decrease in LV ejection fraction. RV systolic function severely reduced, best seen in subcostal windows.  Cardiac catheterization and PCI 11/21/2017:  Prox RCA lesion is 25% stenosed.  1st RPLB lesion is 100% stenosed.  RPDA lesion is 100% stenosed.  Balloon angioplasty was performed using  a BALLOON EMERGE MR 2.0X12.  Post intervention, there is a 0% residual stenosis.  LV end diastolic pressure is normal.  1. Acute inferior STEMI secondary to distal embolization of thrombus into the PDA and PLOM branches from a nonobstructive ulcerative plaque in the proximal RCA 2. Normal LVEDP 3. LV gram not performed due to high creatinine 4. Successful reperfusion of the PDA with aspiration thrombectomy and PTCA  Assessment and Plan:  1. CAD in native artery   2. Mixed hyperlipidemia   3. Essential hypertension    1. CAD in native artery Denies any progressive anginal or exertional symptoms.  Had a recent emergency room visit at [facility] emergency room and ruled out for ACS.  Continues to complain of left-sided precordial chest pain at rest.  States he works out daily at [employer] without any anginal or exertional symptoms.  Continue aspirin 81 mg daily, nitroglycerin sublingual as needed for chest pain, metoprolol tartrate 25 mg p.o. twice daily.  2. Mixed hyperlipidemia Please get a  follow-up lipid profile.  Continue atorvastatin 80 mg daily.  3. Essential hypertension He is normotensive today with a blood pressure of 118/82.  Continue metoprolol tartrate 25 mg daily.  Medication Adjustments/Labs and Tests Ordered: Current medicines are reviewed at length with the patient today.  Concerns regarding medicines are outlined above.   Disposition: Follow-up with Dr. Diona Browner or APP 1 year  Signed, Rennis Harding, NP 11/05/2019 10:28 AM    [facility] [address] [place], [place], [place] [address] Phone: [phone]; Fax: [phone]

## 2019-11-05 ENCOUNTER — Telehealth (INDEPENDENT_AMBULATORY_CARE_PROVIDER_SITE_OTHER): Payer: Medicaid - Out of State | Admitting: Family Medicine

## 2019-11-05 ENCOUNTER — Encounter: Payer: Self-pay | Admitting: Family Medicine

## 2019-11-05 ENCOUNTER — Other Ambulatory Visit: Payer: Self-pay

## 2019-11-05 ENCOUNTER — Encounter: Payer: Self-pay | Admitting: *Deleted

## 2019-11-05 VITALS — BP 118/82 | HR 61 | Ht 75.0 in | Wt 206.2 lb

## 2019-11-05 DIAGNOSIS — I2510 Atherosclerotic heart disease of native coronary artery without angina pectoris: Secondary | ICD-10-CM

## 2019-11-05 DIAGNOSIS — E782 Mixed hyperlipidemia: Secondary | ICD-10-CM | POA: Diagnosis not present

## 2019-11-05 DIAGNOSIS — I10 Essential (primary) hypertension: Secondary | ICD-10-CM

## 2019-11-05 MED ORDER — PANTOPRAZOLE SODIUM 40 MG PO TBEC: 40.0000 mg | DELAYED_RELEASE_TABLET | Freq: Every day | ORAL | 3 refills | Status: AC

## 2019-11-05 NOTE — Patient Instructions (Addendum)
Medication Instructions:  Continue all current medications.  Labwork:  Lipid panel - order given today.    Office will contact with results via phone or letter.    Testing/Procedures: none  Follow-Up: Your physician wants you to follow up in:  1 year.  You will receive a reminder letter in the mail one-two months in advance.  If you don't receive a letter, please call our office to schedule the follow up appointment   Any Other Special Instructions Will Be Listed Below (If Applicable).  If you need a refill on your cardiac medications before your next appointment, please call your pharmacy.

## 2019-11-05 NOTE — Addendum Note (Signed)
Addended by: Lesle Chris on: 11/05/2019 10:36 AM   Modules accepted: Orders

## 2019-12-04 ENCOUNTER — Other Ambulatory Visit: Payer: Self-pay | Admitting: Cardiology

## 2020-05-09 IMAGING — US US RENAL
1 series · 14 of 25 positions shown · non-contrast
Comparison: None.

CLINICAL DATA: Acute renal insufficiency.  Hypertension.

EXAM:
RENAL / URINARY TRACT ULTRASOUND COMPLETE

[Series 1: us renal · 0.24mm/px · 30 acquisitions, 14 frames shown]
[im 1/30]
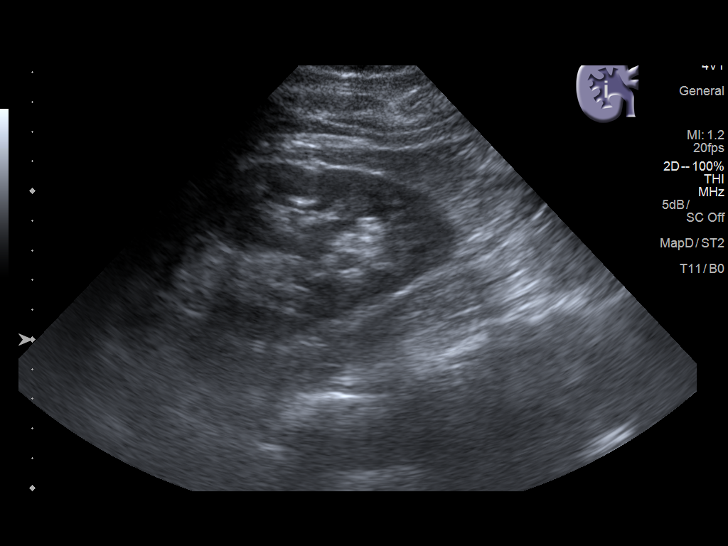
[im 3/30]
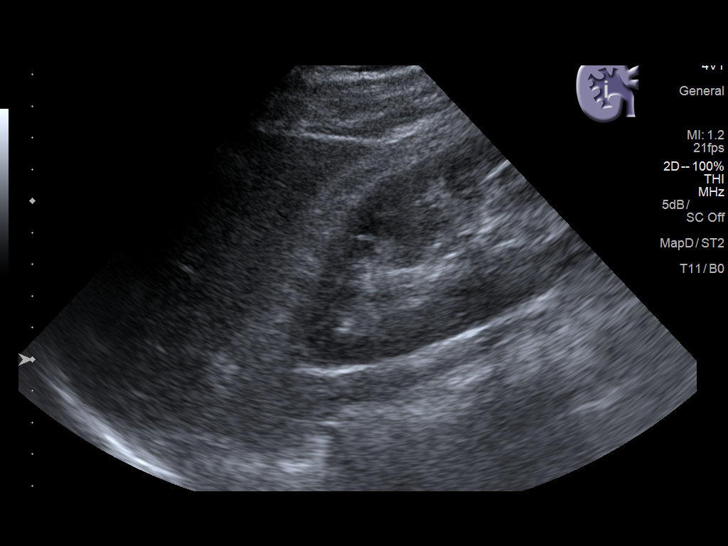
[im 5/30]
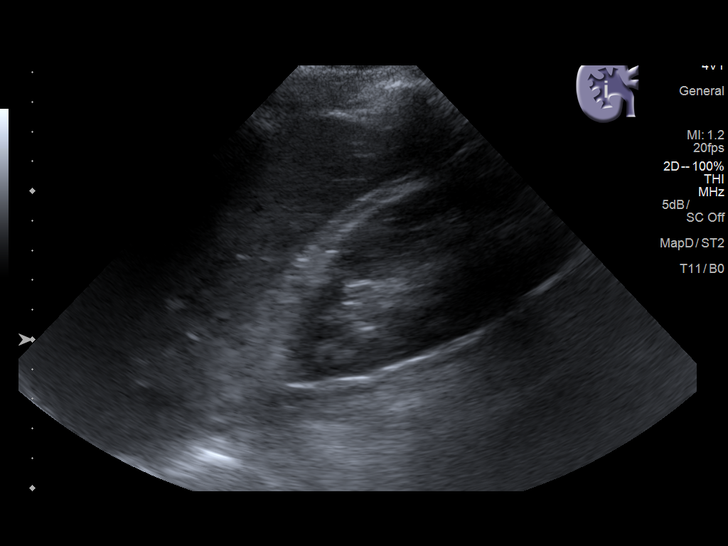
[im 8/30]
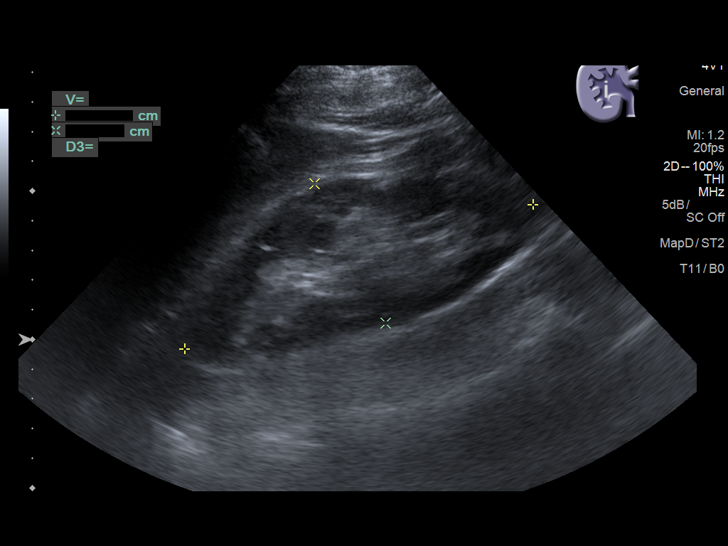
[im 10/30]
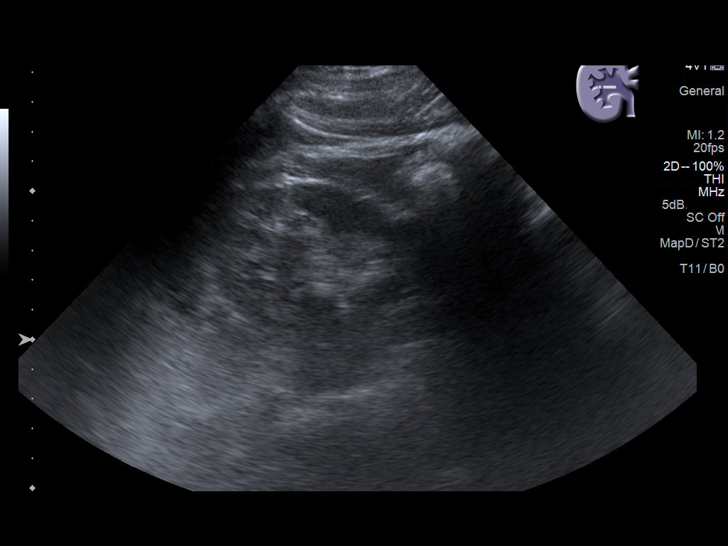
[im 11/30]
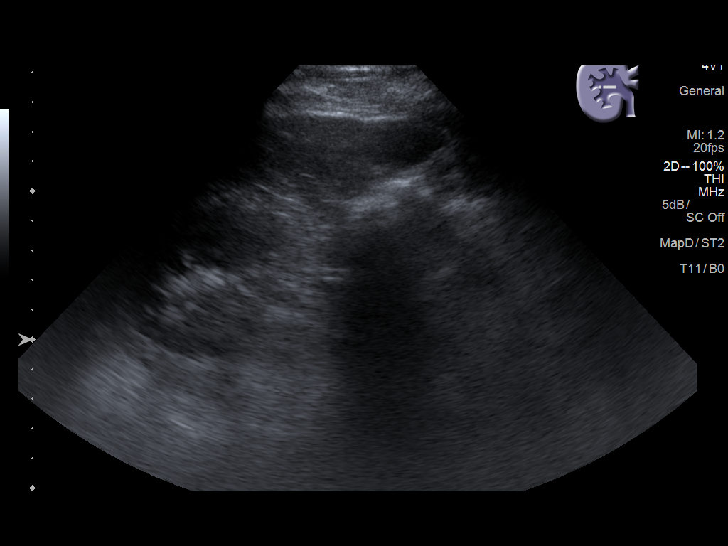
[im 14/30]
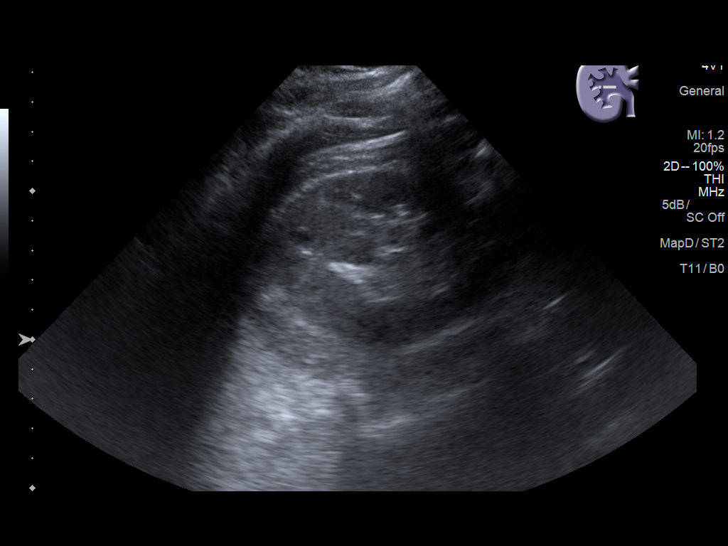
[im 16/30]
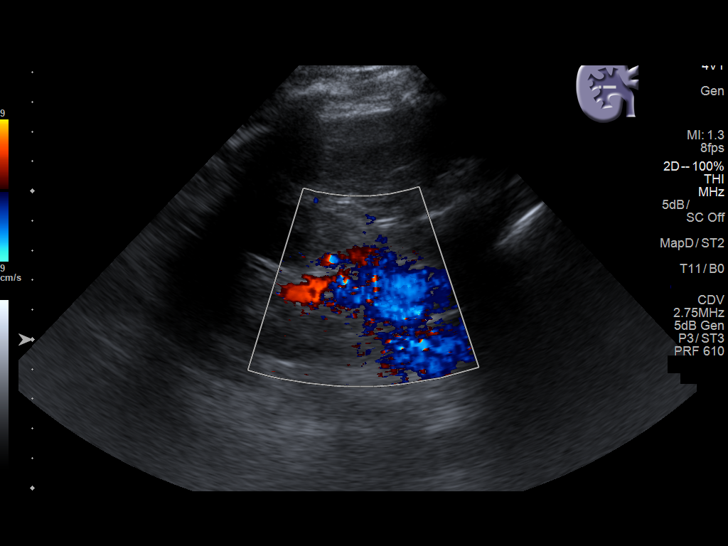
[im 19/30]
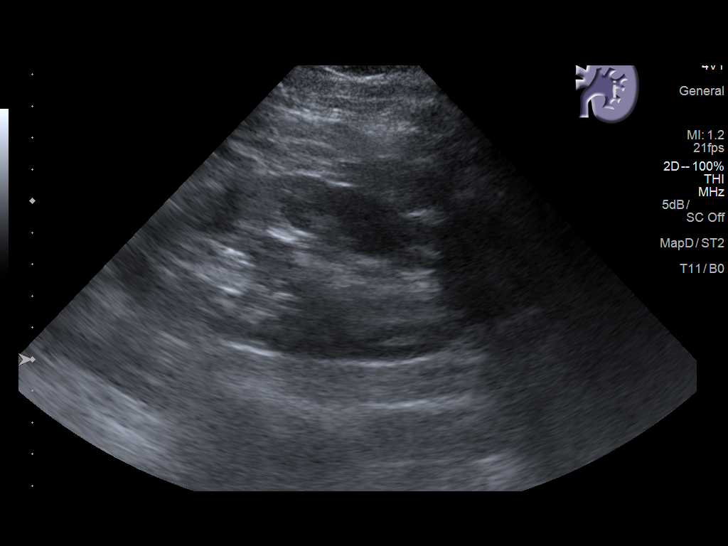
[im 20/30]
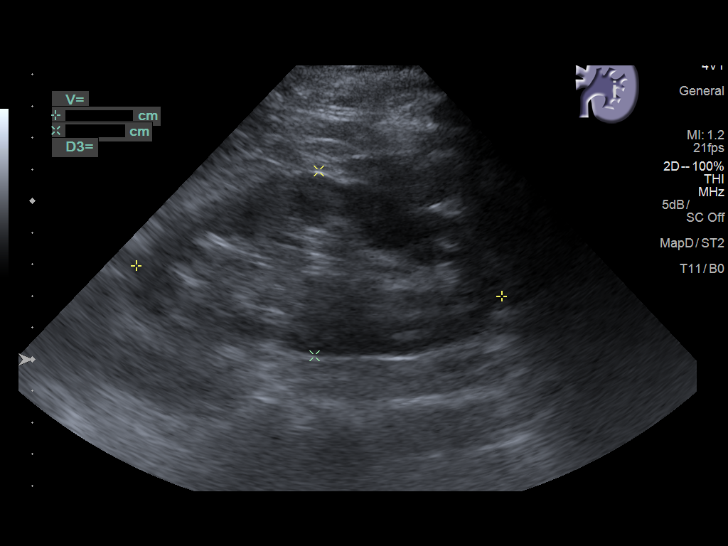
[im 22/30]
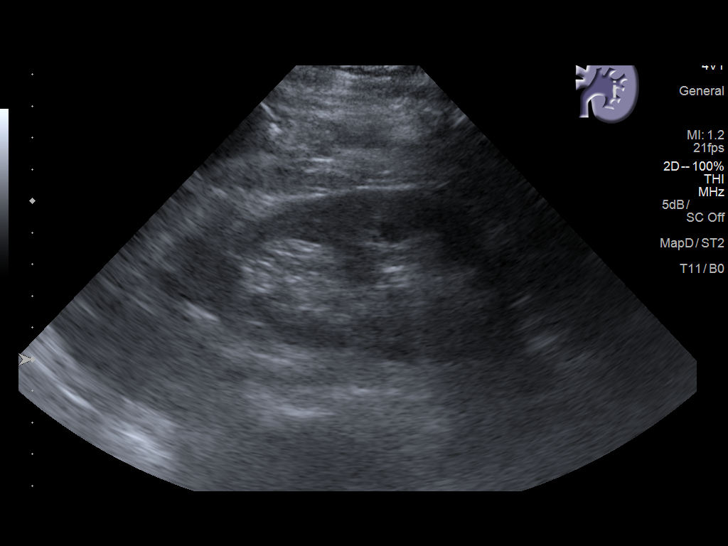
[im 25/30]
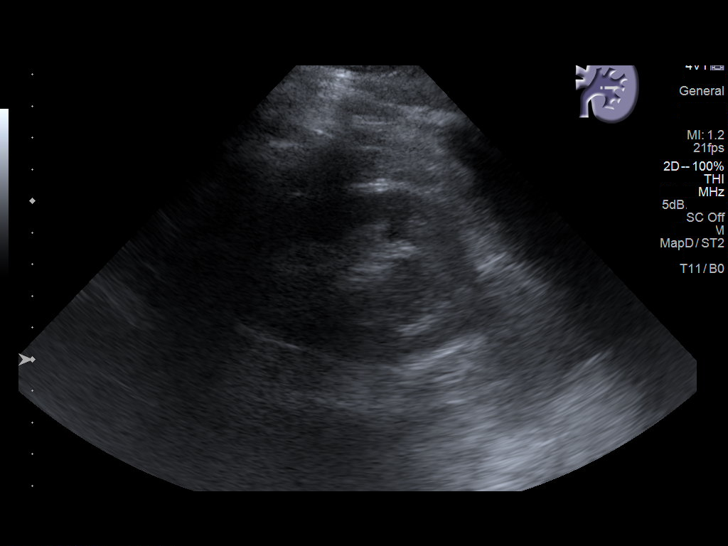
[im 27/30]
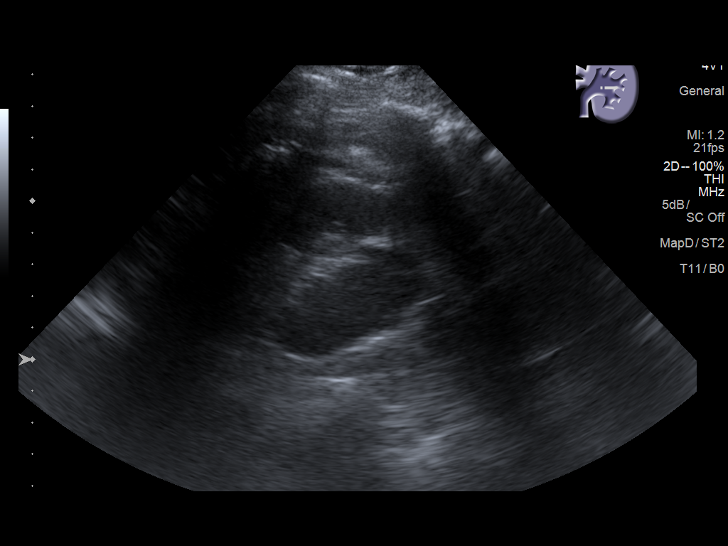
[im 30/30]
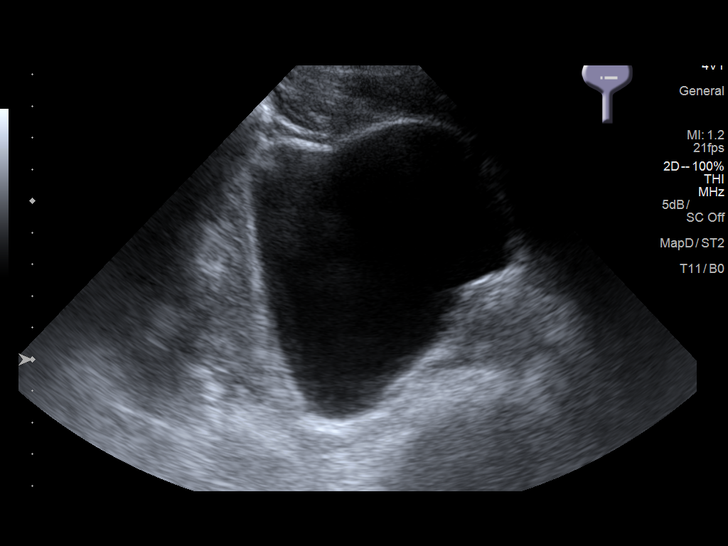

[14 of 25 positions shown; findings below may reference images not displayed]

FINDINGS: Right Kidney:

Renal measurements: 12.7 x 5.2 x 6.0 cm = volume: 210.6 mL .
Echogenicity and renal cortical thickness are within normal limits.
No mass, perinephric fluid, or hydronephrosis visualized. No
sonographically demonstrable calculus or ureterectasis.

Left Kidney:

Renal measurements: 11.6 x 5.8 x 5.2 cm = volume: 185.2 mL.
Echogenicity and renal cortical thickness are within normal limits.
No mass, perinephric fluid, or hydronephrosis visualized. No
sonographically demonstrable calculus or ureterectasis.

Bladder:

Appears normal for degree of bladder distention.
IMPRESSION: Study within normal limits.

## 2020-05-16 IMAGING — CR DG CHEST 2V
2 series · 2 of 2 positions shown · non-contrast
Comparison: Portable chest x-ray January 25, 2018 at [facility] 30 3
a.m..

CLINICAL DATA: Sudden onset of chest pain without other symptoms.
History of myocardial infarction 3 weeks ago.

EXAM:
CHEST - 2 VIEW

[chest pa]
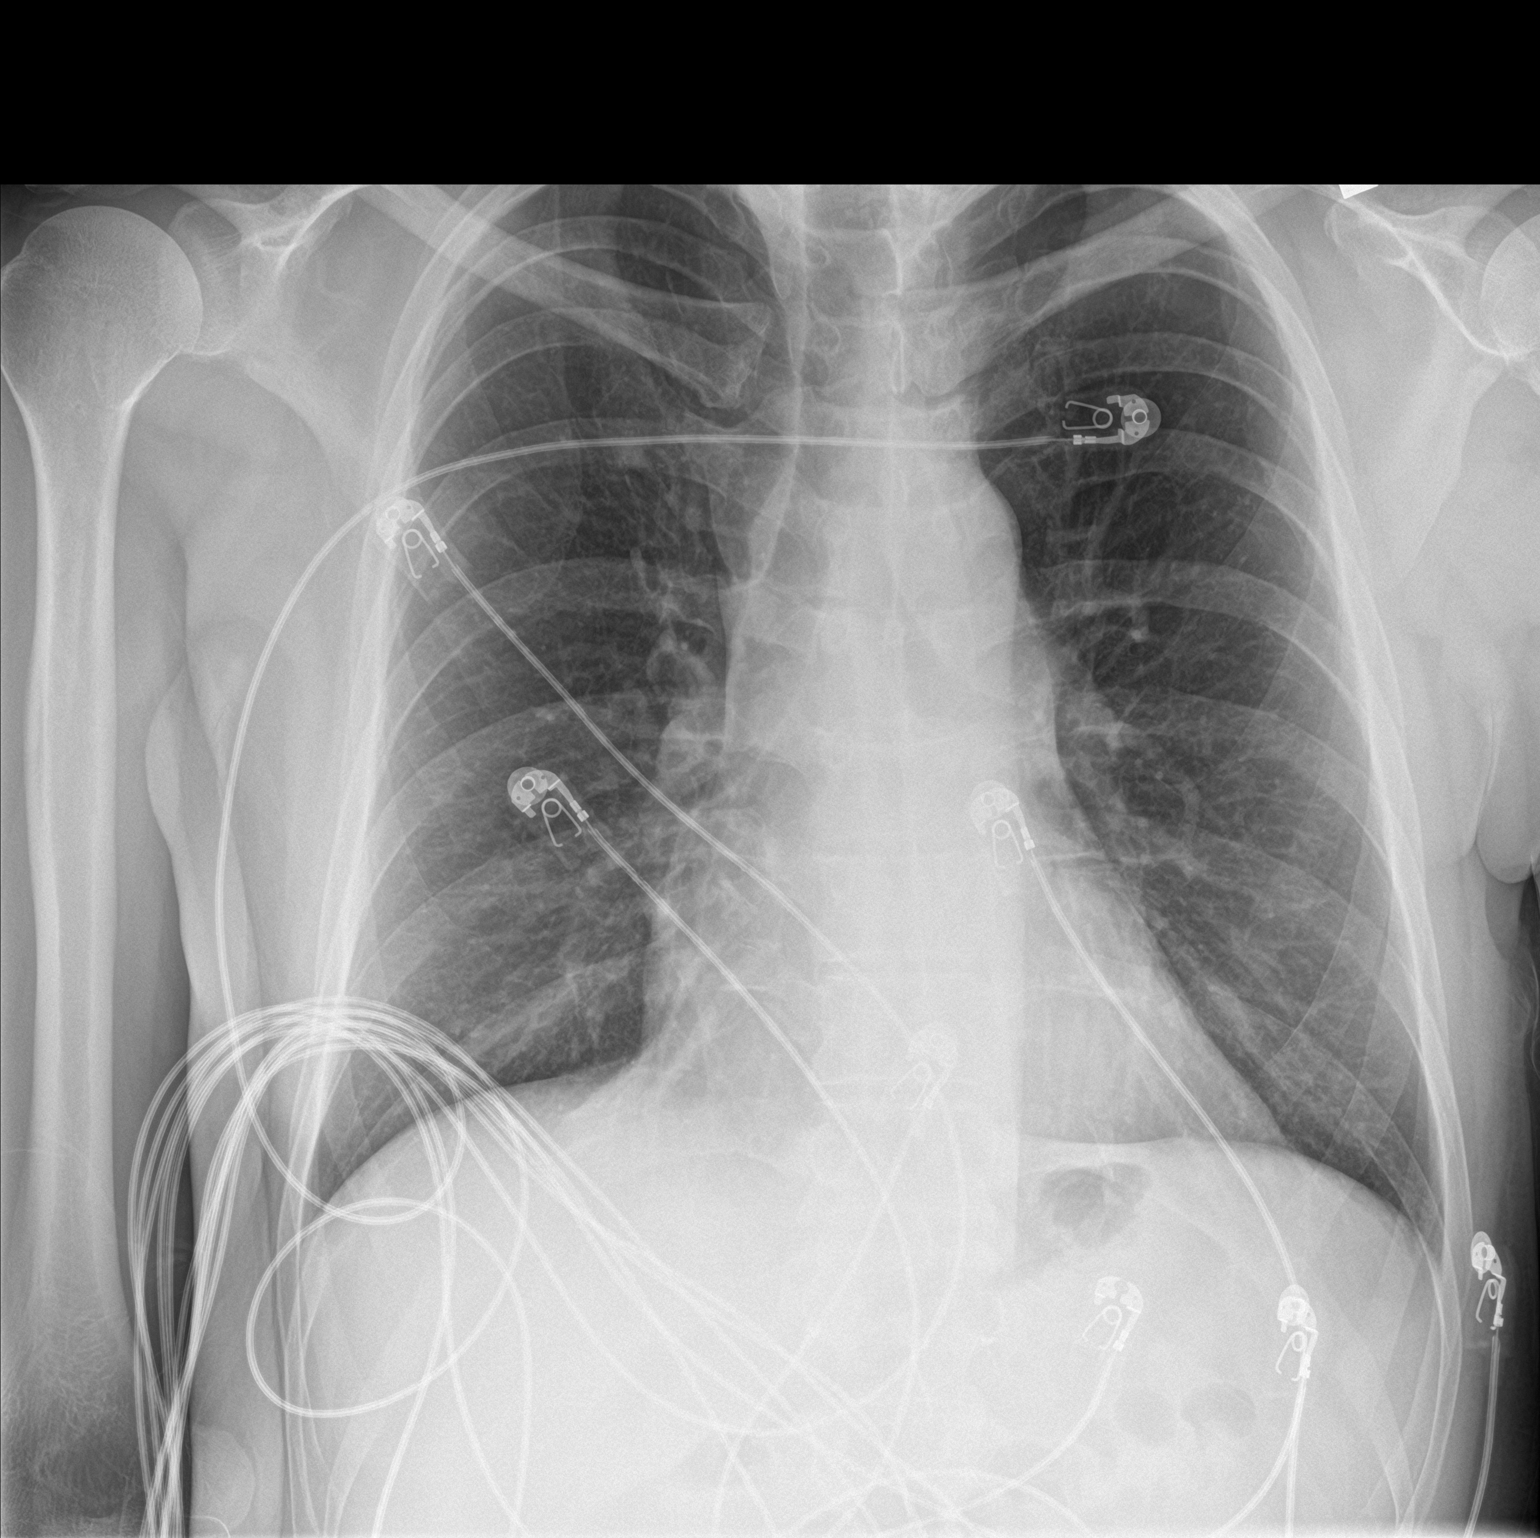

[chest lat]
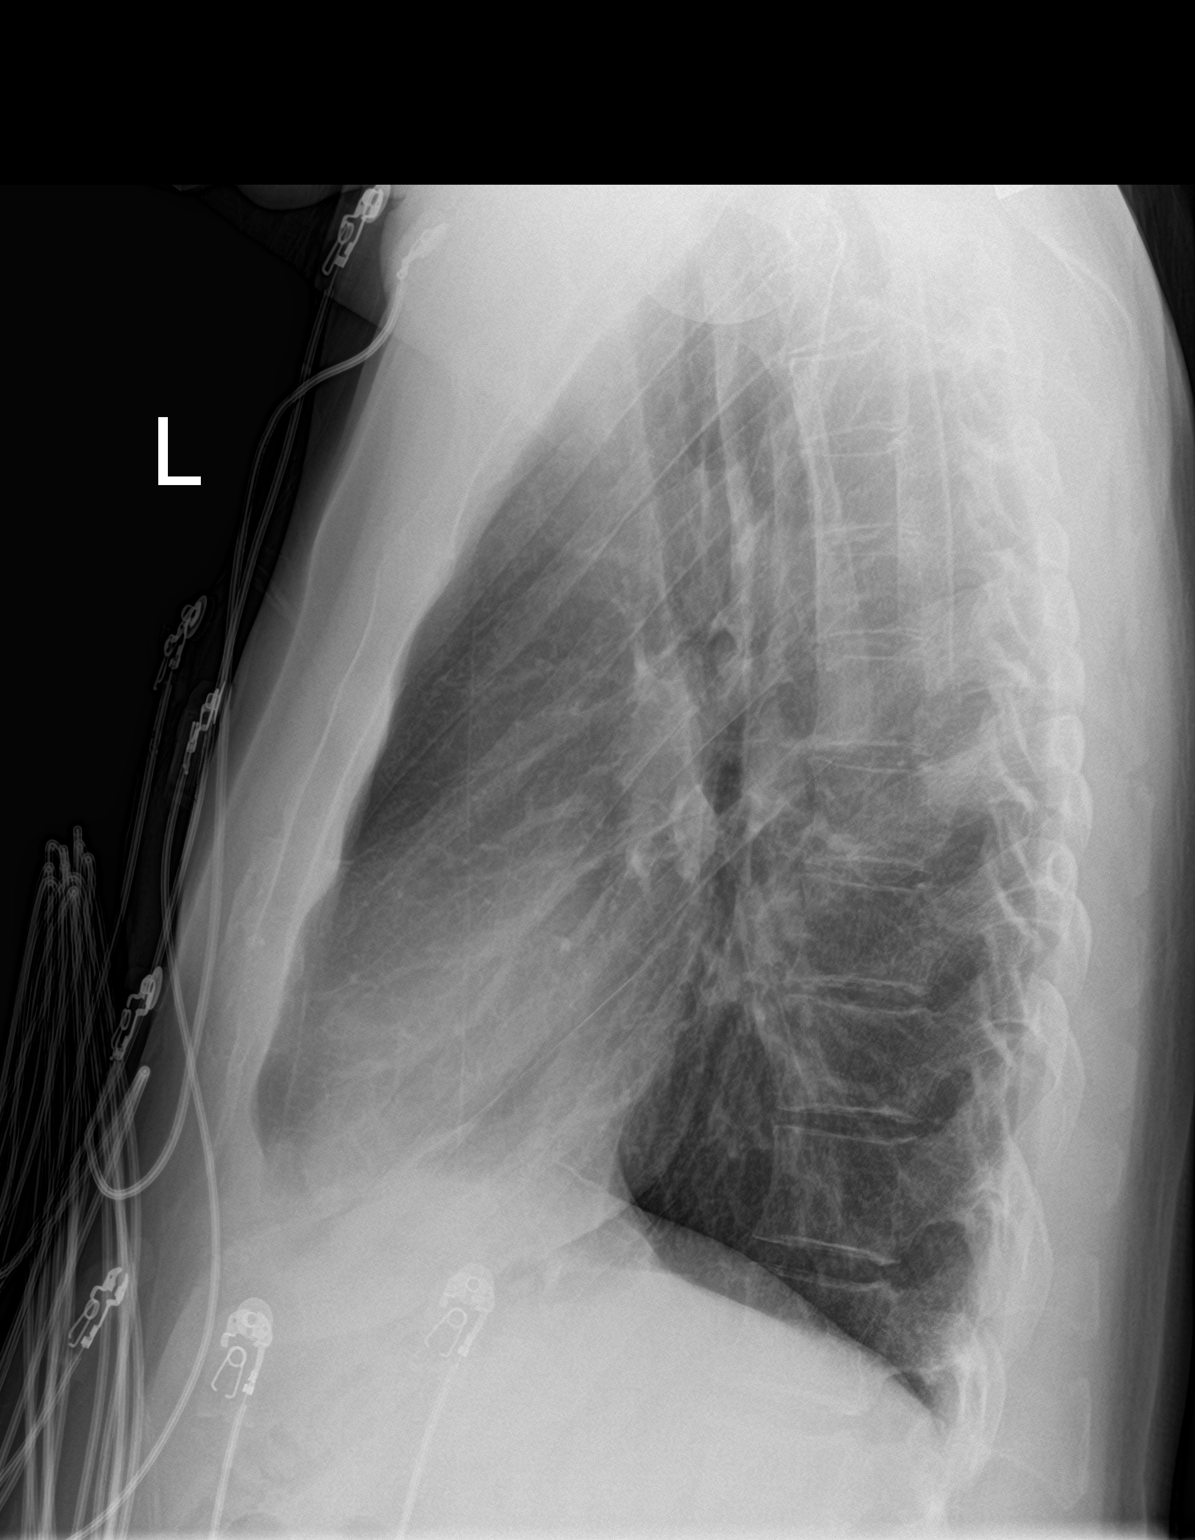

[2 of 2 positions shown; findings below may reference images not displayed]

FINDINGS: The lungs are well-expanded and clear. There is stable soft tissue
density that projects in the region of the right first Marek Deloach
junction. The heart and mediastinal structures are normal. The
trachea is midline. There is no pleural effusion. The bony thorax
exhibits no acute abnormality.
IMPRESSION: There is no CHF nor other acute cardiopulmonary abnormality. Stable
density projecting over the junction of the right first rib
anteriorly with the sternum. An apical lordotic view would be useful
when the patient can undergo the procedure in an effort to judge
whether this reflects a bony finding or if there is a true nodule in
the right upper lung.
